# Patient Record
Sex: Male | Born: 1940
Health system: Southern US, Community
[De-identification: ages and names within clinical notes are randomized; demographics above are authoritative.]

## PROBLEM LIST (undated history)

## (undated) DIAGNOSIS — K219 Gastro-esophageal reflux disease without esophagitis: Secondary | ICD-10-CM

## (undated) DIAGNOSIS — M1990 Unspecified osteoarthritis, unspecified site: Secondary | ICD-10-CM

## (undated) DIAGNOSIS — J449 Chronic obstructive pulmonary disease, unspecified: Secondary | ICD-10-CM

## (undated) HISTORY — DX: Unspecified osteoarthritis, unspecified site: M19.90

## (undated) HISTORY — DX: Gastro-esophageal reflux disease without esophagitis: K21.9

## (undated) HISTORY — PX: CARPAL TUNNEL RELEASE: SHX101

## (undated) HISTORY — PX: ROTATOR CUFF REPAIR: SHX139

## (undated) HISTORY — PX: APPENDECTOMY: SHX54

## (undated) HISTORY — PX: OTHER SURGICAL HISTORY: SHX169

## (undated) HISTORY — DX: Chronic obstructive pulmonary disease, unspecified: J44.9

---

## 2011-07-11 HISTORY — PX: COLONOSCOPY: SHX174

## 2013-08-05 ENCOUNTER — Telehealth: Payer: Self-pay | Admitting: Family Medicine

## 2013-08-05 NOTE — Telephone Encounter (Signed)
Patient has a new patient appointment with you on 10/21/13.  Patient has [employer] and he had to switch from his old doctor.  Patient was at [facility] on 06/26/13 with a ruptured appendix.  Patient went back on new years eve because he had an abscess in area where appendix was and his pelvis.  He went back last Thursday to get his tube out and he was having diarrhea.  [facility] told them if the diarrhea continued, he should see his PCP because there could be an infection.  Can you see patient sooner?

## 2013-08-05 NOTE — Telephone Encounter (Signed)
Yes, 16min appointment as soon as possible, try to get records ahead of time.  If emergently sick, to ER.  Thanks.

## 2013-08-07 ENCOUNTER — Encounter: Payer: Self-pay | Admitting: Family Medicine

## 2013-08-07 ENCOUNTER — Ambulatory Visit (INDEPENDENT_AMBULATORY_CARE_PROVIDER_SITE_OTHER): Payer: Medicare HMO | Admitting: Family Medicine

## 2013-08-07 VITALS — BP 116/80 | HR 84 | Temp 97.4°F | Ht 67.0 in | Wt 177.5 lb

## 2013-08-07 DIAGNOSIS — R197 Diarrhea, unspecified: Secondary | ICD-10-CM

## 2013-08-07 DIAGNOSIS — J449 Chronic obstructive pulmonary disease, unspecified: Secondary | ICD-10-CM

## 2013-08-07 MED ORDER — METRONIDAZOLE 500 MG PO TABS: 500.0000 mg | ORAL_TABLET | Freq: Three times a day (TID) | ORAL | Status: DC

## 2013-08-07 NOTE — Patient Instructions (Signed)
Go to the lab on the way out.  We'll contact you with your lab report. Start flagyl 3 times a day.  Don't drink alcohol with the medicine.  Give me an update in a few days.  Take care.  Glad to see you.

## 2013-08-07 NOTE — Assessment & Plan Note (Signed)
Requesting old records.  Concern for C diff. D/w pt.  He'll collect sample and then start flagyl. etoh caution given.  He agrees. Nontoxic. >25 min spent with face to face with patient, >50% counseling and/or coordinating care.

## 2013-08-07 NOTE — Progress Notes (Signed)
Pre-visit discussion using our clinic review tool. No additional management support is needed unless otherwise documented below in the visit note.  Appy at [facility], then with return for drain placement x2 and abx.  Off abx now, drains out.  Now with diarrhea. Mucous but no blood in stool. Greasy stools. No fevers. Frequent BMs, not solid.   PMH and SH reviewed  ROS: See HPI, otherwise noncontributory.  Meds, vitals, and allergies reviewed.   GEN: nad, alert and oriented HEENT: mucous membranes moist NECK: supple w/o LA CV: rrr.  Sternotomy scar noted PULM: ctab, no inc wob ABD: soft, +bs, no rebound, not ttp, drain and appy sites healed. EXT: no edema SKIN: no acute rash

## 2013-08-08 ENCOUNTER — Telehealth: Payer: Self-pay | Admitting: Family Medicine

## 2013-08-08 NOTE — Telephone Encounter (Signed)
Relevant patient education mailed to patient.  

## 2013-08-08 NOTE — Telephone Encounter (Signed)
Changed in computer.

## 2013-08-08 NOTE — Telephone Encounter (Signed)
Pt made a mistake about the pharmacy he uses. The correct pharmacy is [place] called in yesterday was transferred  to [address] Please change in pt chart.

## 2013-08-11 ENCOUNTER — Telehealth: Payer: Self-pay | Admitting: *Deleted

## 2013-08-11 LAB — CLOSTRIDIUM DIFFICILE EIA

## 2013-08-11 NOTE — Telephone Encounter (Signed)
Already routed to staff.

## 2013-08-11 NOTE — Telephone Encounter (Signed)
CALL REPORT  Positive for C-diff

## 2013-08-26 ENCOUNTER — Encounter: Payer: Self-pay | Admitting: Family Medicine

## 2013-08-26 DIAGNOSIS — R911 Solitary pulmonary nodule: Secondary | ICD-10-CM | POA: Insufficient documentation

## 2013-09-03 ENCOUNTER — Encounter: Payer: Self-pay | Admitting: Family Medicine

## 2013-09-03 ENCOUNTER — Ambulatory Visit (INDEPENDENT_AMBULATORY_CARE_PROVIDER_SITE_OTHER): Payer: Commercial Managed Care - HMO | Admitting: Family Medicine

## 2013-09-03 VITALS — BP 138/78 | HR 96 | Temp 97.7°F | Wt 180.5 lb

## 2013-09-03 DIAGNOSIS — R197 Diarrhea, unspecified: Secondary | ICD-10-CM | POA: Diagnosis not present

## 2013-09-03 DIAGNOSIS — R059 Cough, unspecified: Secondary | ICD-10-CM | POA: Diagnosis not present

## 2013-09-03 DIAGNOSIS — R05 Cough: Secondary | ICD-10-CM | POA: Insufficient documentation

## 2013-09-03 NOTE — Assessment & Plan Note (Signed)
Resolved

## 2013-09-03 NOTE — Progress Notes (Signed)
Pre visit review using our clinic review tool, if applicable. No additional management support is needed unless otherwise documented below in the visit note.  H/o C diff. Prev diarrhea is resolved.  No mucous now.  No fevers, no abd pain.  Off flagyl now.    He just returned from a trip out [place] with family.  He had a good trip.    Cough started about 2-3 days ago.  "Raw under my throat."  Chest feels tight.  Using his spiriva daily, SABA about 2 times in the last week with some temporary relief.   No wheeze noted by patient.  No sputum usually.  Occ/rare sputum, generally clear.  He had one episode of discolored sputum a few days ago, but no more in the meantime.   Rhinorrhea.  No ear pain.  No facial pain.  He had a recent HA, that had resolved.  Can walk w/o getting SOB; he can get SOB going up a few flights of stairs but this is at baseline.   Meds, vitals, and allergies reviewed.   ROS: See HPI.  Otherwise, noncontributory.  nad ncat Tm wnl B Nasal and OP exam wnl except for mild OP cobblestoning.  Neck supple, no LA Ctab except for UAN.   No inc in wob. abd soft Ext well perfused.

## 2013-09-03 NOTE — Assessment & Plan Note (Signed)
Nontoxic.  Likely viral. Would use SABA more frequently in the meantime and this should resolve.  F/u prn.  Would like to avoid abx if at all possible given recent C diff.  D/w pt.  He agrees.

## 2013-09-03 NOTE — Patient Instructions (Signed)
Use the spiriva daily as you normally would.  Use the albuterol up to 3 times a day in the meantime for the cough.  That should help some.  If you get more short of breath or cough up a lot of discolored sputum, then notify us.  If you have more diarrhea with mucous, then call us.  Take care.

## 2013-09-08 ENCOUNTER — Telehealth: Payer: Self-pay | Admitting: Family Medicine

## 2013-09-08 MED ORDER — METRONIDAZOLE 500 MG PO TABS: 500.0000 mg | ORAL_TABLET | Freq: Three times a day (TID) | ORAL | Status: DC

## 2013-09-08 NOTE — Telephone Encounter (Signed)
Restart flagyl, update [place] in 1-2 days.  Strict hand washing.  I need an update to make some plans about his meds in the next few days.  Thanks. rx sent.

## 2013-09-08 NOTE — Telephone Encounter (Signed)
Pt is calling and wanted to let you know that he still has an infection in his bowel. Pt has been in and out of the bathroom with diarrhea. Please advise.

## 2013-09-08 NOTE — Telephone Encounter (Signed)
No vomiting or fever.  No bloody stools but there is "phlegm" with the stools just as it was before when he had the infection in his stomach.  Patient has had diarrhea 5 times since 5:30 this morning.  Please advise.

## 2013-09-08 NOTE — Telephone Encounter (Signed)
Patient advised.

## 2013-09-08 NOTE — Telephone Encounter (Signed)
Please call him back and see what details you can get- frequency, bloody, vomiting, fever, overall progression, etc.  Thanks.

## 2013-10-06 ENCOUNTER — Ambulatory Visit (INDEPENDENT_AMBULATORY_CARE_PROVIDER_SITE_OTHER): Payer: Medicare HMO | Admitting: Family Medicine

## 2013-10-06 ENCOUNTER — Encounter: Payer: Self-pay | Admitting: Family Medicine

## 2013-10-06 VITALS — BP 130/84 | HR 87 | Temp 97.7°F | Wt 183.8 lb

## 2013-10-06 DIAGNOSIS — R197 Diarrhea, unspecified: Secondary | ICD-10-CM

## 2013-10-06 MED ORDER — NONFORMULARY OR COMPOUNDED ITEM: Status: DC

## 2013-10-06 NOTE — Patient Instructions (Addendum)
I called in the vancomycin to [place].   [place], [place] Start it when they call you.   If you aren't improving, then let me know.  Take care.

## 2013-10-06 NOTE — Progress Notes (Signed)
Pre visit review using our clinic review tool, if applicable. No additional management support is needed unless otherwise documented below in the visit note.  H/o C diff late 07/2013.  Got better with flagyl, then sx returned early 09/2013.  Restarted flagyl and improved. Now with return of sx last week.  Mucous in stool.  No fevers.  Mild abd pain, not cramping but "churning", better with a BM.  Frequent stools recently.  No blood in stool.    Meds, vitals, and allergies reviewed.   ROS: See HPI.  Otherwise, noncontributory.  GEN: nad, alert and oriented HEENT: mucous membranes moist NECK: supple w/o LA CV: rrr.  PULM: ctab, no inc wob ABD: soft, +bs, not ttp EXT: no edema SKIN: no acute rash

## 2013-10-07 NOTE — Assessment & Plan Note (Signed)
Presumed return of c diff.  Would treat even if neg c diff given the recent event. D/w pt.  vanc taper, have him update [place] if not improved. D/w pt about precautions in meantime. Nontoxic.

## 2013-10-16 ENCOUNTER — Telehealth: Payer: Self-pay

## 2013-10-16 DIAGNOSIS — R197 Diarrhea, unspecified: Secondary | ICD-10-CM

## 2013-10-16 NOTE — Telephone Encounter (Signed)
Mrs Leavitt said pt was seen 10/06/13 and has been taking vancomycin 125 mg 4 x a day for 1 week; pt cannot see any improvement; pt still having diarrhea with trace of mucus in stool. Pt has had diarrhea x 4 today. No fever, N or V and no abd pain. Pt is also taking probiotic and eating yogurt. Pt wants to know if he should see some improvement by now. [place]. Mrs Macgowan request cb.

## 2013-10-16 NOTE — Telephone Encounter (Signed)
Patient advised.

## 2013-10-16 NOTE — Telephone Encounter (Signed)
I hoped that he would have already had some improvement. I would recheck his stool studies at this point and if still positive, we may have to get him to see the ID clinic.  I'll await the stool studies.  Thanks.  Orders are in. Continue the medicine for now.  Thanks.

## 2013-10-17 ENCOUNTER — Other Ambulatory Visit: Payer: Medicare HMO

## 2013-10-17 DIAGNOSIS — R197 Diarrhea, unspecified: Secondary | ICD-10-CM

## 2013-10-17 NOTE — Addendum Note (Signed)
Addended by: Ellamae Sia on: 10/17/2013 09:27 AM   Modules accepted: Orders

## 2013-10-18 LAB — CLOSTRIDIUM DIFFICILE EIA: CDIFTX: NEGATIVE

## 2013-10-21 ENCOUNTER — Encounter: Payer: Self-pay | Admitting: *Deleted

## 2013-10-21 ENCOUNTER — Ambulatory Visit: Payer: Self-pay | Admitting: Family Medicine

## 2013-10-21 LAB — STOOL CULTURE

## 2013-11-03 ENCOUNTER — Telehealth: Payer: Self-pay | Admitting: Family Medicine

## 2013-11-03 DIAGNOSIS — R197 Diarrhea, unspecified: Secondary | ICD-10-CM

## 2013-11-03 NOTE — Telephone Encounter (Signed)
Patient Information:  Caller Name: Ricardo Wilson  Phone: [phone]  Patient: Ricardo Wilson, Ricardo Wilson  Gender: Male  DOB: [birth year]  Age: 73 Years  PCP: Elsie Stain Brigitte Pulse) [facility])  Office Follow Up:  Does the office need to follow up with this patient?: Yes  Instructions For The Office: Wife/Kerry and patient asking if he needs to continue on the current Vancomycin for 49 days or if he needs to make appointment with gastroenterologist.  Wanting to know before refilling the expensive medication Please review and contact wife Ricardo Wilson at [phone] until 2 pm or patient's cell  [phone] after 2 pm.   Symptoms  Reason For Call & Symptoms: Ruptured appendix in 06/2013    bowel problem since 07/2013  had C-Diff infection.  Did not improve so started  Vancomycin for 49 days.  Pharmacy would not give all of medication  at one time.  First round of Rx cost $200.  Now considering getting second round of Rx to continue toward the total 49 days.  Feels he has not improved, still having 5-6 diarrthea stools per day.  Repeated C-Diff and told it was negative.  Reviewed Health History In EMR: Yes  Reviewed Medications In EMR: Yes  Reviewed Allergies In EMR: Yes  Reviewed Surgeries / Procedures: Yes  Date of Onset of Symptoms: Unknown  Treatments Tried: Vancomycin  Treatments Tried Worked: No  Guideline(s) Used:  Diarrhea  Disposition Per Guideline:   Callback by PCP Today  Reason For Disposition Reached:   Age > 50 years  Advice Given:  N/A  Patient Will Follow Care Advice:  YES

## 2013-11-03 NOTE — Telephone Encounter (Signed)
Patient advised.   Patient says he is not having watery diarrhea now but is having relatively small amounts of formed stool 6 or 8 times a day.

## 2013-11-03 NOTE — Telephone Encounter (Signed)
I would fill the next round now and go ahead with he GI eval.  I put in the referral.  Thanks.

## 2013-11-03 NOTE — Telephone Encounter (Signed)
Then I would continue as planned. Thanks.

## 2013-11-03 NOTE — Telephone Encounter (Signed)
Patient advised.

## 2014-06-02 ENCOUNTER — Encounter: Payer: Self-pay | Admitting: Family Medicine

## 2014-06-02 ENCOUNTER — Ambulatory Visit (INDEPENDENT_AMBULATORY_CARE_PROVIDER_SITE_OTHER): Payer: Commercial Managed Care - HMO | Admitting: Family Medicine

## 2014-06-02 VITALS — BP 126/72 | HR 64 | Temp 97.8°F | Wt 185.5 lb

## 2014-06-02 DIAGNOSIS — IMO0001 Reserved for inherently not codable concepts without codable children: Secondary | ICD-10-CM | POA: Insufficient documentation

## 2014-06-02 DIAGNOSIS — S61210D Laceration without foreign body of right index finger without damage to nail, subsequent encounter: Secondary | ICD-10-CM

## 2014-06-02 NOTE — Assessment & Plan Note (Signed)
Will need to heal by second intention - discussed this. Tendon does not seem to be involved. Does not appear infected. Pt having trouble with augmentin but desires to continue with this treatment. rec take with food, start eating yogurt while on med Red flags to seek urgent care discussed, otherwise return in 3d with PCP or myself for recheck.

## 2014-06-02 NOTE — Patient Instructions (Addendum)
Wound cleaned and dressed today with antibiotic ointment. Watch for spreading redness past dressing or return sooner if worsening pain at finger. Take augmentin (amoxicillin/clavulanate) with a meal and start eating yogurt once daily. Return in 3 days for recheck.

## 2014-06-02 NOTE — Progress Notes (Signed)
   BP 126/72 mmHg  Pulse 64  Temp(Src) 97.8 F (36.6 C) (Oral)  Wt 185 lb 8 oz (84.142 kg)   CC: ER follow up  Subjective:    Patient ID: Ricardo Wilson., male    DOB: 1941-05-16, 73 y.o.   MRN: [id number]  HPI: Adley Mazurowski. is a 73 y.o. male presenting on 06/02/2014 for Follow-up   Suffered dog bite 6 days ago (05/28/2014), it was his own dog (small poodle) while he was putting it in his crate, wrapped up his hand due to bleeding but did not seek care.   Checked his hand the next morning and decided to seek care at ER in [facility] (just outside of [place]) and treated with augmentin 875/125mg  bid which has caused diarrhea but otherwise tolerating well. No records available, pt does not remember hospital name. Maudie Mercury will try and obtain records.  No fevers/chills  Relevant past medical, surgical, family and social history reviewed and updated as indicated.  Allergies and medications reviewed and updated. Current Outpatient Prescriptions on File Prior to Visit  Medication Sig  . albuterol (PROVENTIL HFA;VENTOLIN HFA) 108 (90 BASE) MCG/ACT inhaler Inhale into the lungs every 6 (six) hours as needed for wheezing or shortness of breath.  . celecoxib (CELEBREX) 200 MG capsule Take 200 mg by mouth daily.  . ranitidine (ZANTAC) 300 MG tablet Take 300 mg by mouth 2 (two) times daily.  Marland Kitchen tiotropium (SPIRIVA) 18 MCG inhalation capsule Place 18 mcg into inhaler and inhale daily.   No current facility-administered medications on file prior to visit.    Review of Systems Per HPI unless specifically indicated above    Objective:    BP 126/72 mmHg  Pulse 64  Temp(Src) 97.8 F (36.6 C) (Oral)  Wt 185 lb 8 oz (84.142 kg)  Physical Exam  Constitutional: He appears well-developed and well-nourished. No distress.  Musculoskeletal: He exhibits no edema.  R hand 2nd digit with transverse laceration approx 1.5cm length across palmar surface of PIP without surrounding erythema or  induration. Laceration edges not approximated. Smaller laceration distal to initial one Able to flex/extend at 2nd digit IP joints  Nursing note and vitals reviewed.  Wound cleaned with betadine then soaked in NS, then dressing reapplied.    Assessment & Plan:   Problem List Items Addressed This Visit    Laceration of second finger, right - Primary    Will need to heal by second intention - discussed this. Tendon does not seem to be involved. Does not appear infected. Pt having trouble with augmentin but desires to continue with this treatment. rec take with food, start eating yogurt while on med Red flags to seek urgent care discussed, otherwise return in 3d with PCP or myself for recheck.        Follow up plan: Return in about 3 days (around 06/05/2014), or if symptoms worsen or fail to improve, for follow up visit.

## 2014-06-02 NOTE — Progress Notes (Signed)
Pre visit review using our clinic review tool, if applicable. No additional management support is needed unless otherwise documented below in the visit note. 

## 2014-06-05 ENCOUNTER — Ambulatory Visit (INDEPENDENT_AMBULATORY_CARE_PROVIDER_SITE_OTHER): Payer: Commercial Managed Care - HMO | Admitting: Family Medicine

## 2014-06-05 ENCOUNTER — Encounter: Payer: Self-pay | Admitting: Family Medicine

## 2014-06-05 VITALS — BP 120/80 | HR 64 | Temp 97.7°F | Wt 183.0 lb

## 2014-06-05 DIAGNOSIS — S61210D Laceration without foreign body of right index finger without damage to nail, subsequent encounter: Secondary | ICD-10-CM

## 2014-06-05 DIAGNOSIS — Z23 Encounter for immunization: Secondary | ICD-10-CM

## 2014-06-05 MED ORDER — RANITIDINE HCL 300 MG PO TABS: 300.0000 mg | ORAL_TABLET | Freq: Two times a day (BID) | ORAL | Status: DC

## 2014-06-05 MED ORDER — CELECOXIB 200 MG PO CAPS: 200.0000 mg | ORAL_CAPSULE | Freq: Every day | ORAL | Status: DC

## 2014-06-05 MED ORDER — ALBUTEROL SULFATE HFA 108 (90 BASE) MCG/ACT IN AERS: 1.0000 | INHALATION_SPRAY | Freq: Four times a day (QID) | RESPIRATORY_TRACT | Status: DC | PRN

## 2014-06-05 MED ORDER — TIOTROPIUM BROMIDE MONOHYDRATE 18 MCG IN CAPS: 18.0000 ug | ORAL_CAPSULE | Freq: Every day | RESPIRATORY_TRACT | Status: DC

## 2014-06-05 NOTE — Progress Notes (Signed)
Pre visit review using our clinic review tool, if applicable. No additional management support is needed unless otherwise documented below in the visit note.  Injury date was 05/28/14.  Treated out of town at [place], had f/u with Dr. Darnell Level.  Splinted in meantime, prev notes reviewed. Splint is annoying, but protecting his finger well.  Less redness and pain in the meantime.  Here for f/u and recheck. Due for tetanus, on abx.  No ADE on med.   Meds, vitals, and allergies reviewed.   ROS: See HPI.  Otherwise, noncontributory.  nad ncat R hand 2nd digit with transverse laceration approx 1.5cm length across palmar surface of PIP without surrounding erythema or induration.  Laceration edges not approximated but appears to be healing well by secondary intention.  Able to flex/extend at 2nd digit IP joints  His hand is NV intact.

## 2014-06-05 NOTE — Patient Instructions (Addendum)
Recheck on Tuesday or Wednesday of next week.   Keep the splint on for now.  Take care.  Glad to see you.  Schedule a physical for spring of 2016.

## 2014-06-08 NOTE — Assessment & Plan Note (Signed)
Tetanus shot today, I shortened his splint by about 1cm to make it more comfortable.  He liked the change.  The finger is still well protected.  Continue abx in the meantime.  I'll recheck him next week.  Call back sooner prn.  He agrees.

## 2014-06-09 ENCOUNTER — Encounter: Payer: Self-pay | Admitting: Family Medicine

## 2014-06-09 ENCOUNTER — Ambulatory Visit: Payer: Commercial Managed Care - HMO | Admitting: Family Medicine

## 2014-06-09 VITALS — BP 124/82 | HR 75 | Temp 97.6°F | Wt 183.8 lb

## 2014-06-09 DIAGNOSIS — S61210D Laceration without foreign body of right index finger without damage to nail, subsequent encounter: Secondary | ICD-10-CM

## 2014-06-09 MED ORDER — ALBUTEROL SULFATE HFA 108 (90 BASE) MCG/ACT IN AERS
1.0000 | INHALATION_SPRAY | Freq: Four times a day (QID) | RESPIRATORY_TRACT | Status: DC | PRN
Start: 2014-06-09 — End: 2016-03-24

## 2014-06-09 NOTE — Patient Instructions (Signed)
No charge for today's visit.  Keep using the splint for another few days, then gradually wean out of the splint.   If you have any more redness or pain, then let me know.

## 2014-06-09 NOTE — Progress Notes (Signed)
Pre visit review using our clinic review tool, if applicable. No additional management support is needed unless otherwise documented below in the visit note.  Here for f/u finger lac.  In splint.  Done with abx.  No fevers or drainage.  Doing well overall.   Meds, vitals, and allergies reviewed.   ROS: See HPI.  Otherwise, noncontributory.  nad R 2nd finger with good granulation tissue on the lac w/o spreading erythema.  NV intact.  Area redressed and resplinted.

## 2014-06-10 NOTE — Assessment & Plan Note (Signed)
Improving, wouldn't continue abx at this point.  Use splint for a few more days, then wean out (but would still use when working with his hands).  He agrees.  F/u prn.  Should do well, though with a scar likely to form. No charge for visit.

## 2014-07-13 ENCOUNTER — Encounter: Payer: Self-pay | Admitting: Family Medicine

## 2014-07-24 ENCOUNTER — Telehealth: Payer: Self-pay | Admitting: *Deleted

## 2014-07-24 NOTE — Telephone Encounter (Signed)
Lm on pts vm requesting a call back if wanting to schedule a flu shot 

## 2014-12-28 ENCOUNTER — Other Ambulatory Visit: Payer: Self-pay | Admitting: Family Medicine

## 2014-12-28 NOTE — Telephone Encounter (Signed)
Electronic refill request.  Patient has only had acute visits for the entire year of 2015.  Please advise.  Celebrex Last Filled:    30 capsule 5 Rf on 06/05/2014  Ranitidine Last Filled:    60 tablet 5 RF on 06/05/2014

## 2014-12-29 NOTE — Telephone Encounter (Signed)
Sent.  Needs CPE scheduled.  Thanks.  

## 2014-12-29 NOTE — Telephone Encounter (Signed)
Left detailed message on voicemail.  

## 2015-07-11 ENCOUNTER — Telehealth: Payer: Self-pay | Admitting: Family Medicine

## 2015-07-13 NOTE — Telephone Encounter (Signed)
Electronic refill request. Last Filled:    30 capsule 5 12/29/2014  Please advise.

## 2015-07-14 NOTE — Telephone Encounter (Signed)
Please call patient and  CPE as instructed.

## 2015-07-14 NOTE — Telephone Encounter (Signed)
Sent.  Due for CPE.  Thanks.  

## 2015-07-15 NOTE — Telephone Encounter (Signed)
Spoke with pt  He didn't want to schedule appointment at this time Please close

## 2015-09-13 ENCOUNTER — Other Ambulatory Visit: Payer: Self-pay | Admitting: *Deleted

## 2015-09-13 NOTE — Telephone Encounter (Signed)
If he'll schedule a physical, then send #30 with 2 rf.  If not, then decline it.   He should have routine labs if still going to take this medicine.

## 2015-09-13 NOTE — Telephone Encounter (Signed)
Received faxed refill Last refill 07/14/15 #30/1 Last office visit 06/09/14 Patient advised on 07/15/15 that he needed to schedule a CPE which he declined to schedule an appointment.

## 2015-09-14 NOTE — Telephone Encounter (Signed)
Spoke with patient who said if he has to come in for an OV, [place], he will just stop taking it.  I thanked the patient for his time and declined the refill as instructed.

## 2015-09-14 NOTE — Telephone Encounter (Signed)
See below.  Discharge patient.  Thanks.

## 2015-12-16 ENCOUNTER — Telehealth: Payer: Self-pay | Admitting: Family Medicine

## 2015-12-16 NOTE — Telephone Encounter (Signed)
Spoke to pt wife. She has been trying to get pt to make appt. Per [facility] has not been seen since 2015. Pt wife will tell pt to call back and schedule.

## 2016-01-18 ENCOUNTER — Other Ambulatory Visit: Payer: Self-pay | Admitting: *Deleted

## 2016-01-18 MED ORDER — RANITIDINE HCL 300 MG PO TABS: 300.0000 mg | ORAL_TABLET | Freq: Two times a day (BID) | ORAL | Status: DC

## 2016-01-22 ENCOUNTER — Observation Stay (HOSPITAL_COMMUNITY)
Admission: EM | Admit: 2016-01-22 | Discharge: 2016-01-24 | Disposition: A | Discharge status: Home / Self Care | Payer: Commercial Managed Care - HMO | Attending: Surgery | Admitting: Surgery

## 2016-01-22 ENCOUNTER — Encounter (HOSPITAL_COMMUNITY): Payer: Self-pay

## 2016-01-22 DIAGNOSIS — K219 Gastro-esophageal reflux disease without esophagitis: Secondary | ICD-10-CM | POA: Insufficient documentation

## 2016-01-22 DIAGNOSIS — J449 Chronic obstructive pulmonary disease, unspecified: Secondary | ICD-10-CM | POA: Insufficient documentation

## 2016-01-22 DIAGNOSIS — K4030 Unilateral inguinal hernia, with obstruction, without gangrene, not specified as recurrent: Secondary | ICD-10-CM | POA: Diagnosis not present

## 2016-01-22 DIAGNOSIS — Z87891 Personal history of nicotine dependence: Secondary | ICD-10-CM | POA: Insufficient documentation

## 2016-01-22 DIAGNOSIS — Z79899 Other long term (current) drug therapy: Secondary | ICD-10-CM | POA: Insufficient documentation

## 2016-01-22 DIAGNOSIS — K4090 Unilateral inguinal hernia, without obstruction or gangrene, not specified as recurrent: Secondary | ICD-10-CM

## 2016-01-22 LAB — I-STAT CHEM 8, ED
BUN: 24 mg/dL — ABNORMAL HIGH (ref 6–20)
Calcium, Ion: 1.17 mmol/L (ref 1.12–1.23)
Chloride: 104 mmol/L (ref 101–111)
Creatinine, Ser: 1.1 mg/dL (ref 0.61–1.24)
Glucose, Bld: 112 mg/dL — ABNORMAL HIGH (ref 65–99)
HCT: 41 % (ref 39.0–52.0)
Hemoglobin: 13.9 g/dL (ref 13.0–17.0)
Potassium: 4.2 mmol/L (ref 3.5–5.1)
Sodium: 141 mmol/L (ref 135–145)
TCO2: 25 mmol/L (ref 0–100)

## 2016-01-22 LAB — CBC WITH DIFFERENTIAL/PLATELET
Basophils Absolute: 0 10*3/uL (ref 0.0–0.1)
Basophils Relative: 0 %
Eosinophils Absolute: 0.2 10*3/uL (ref 0.0–0.7)
Eosinophils Relative: 2 %
HCT: 42.8 % (ref 39.0–52.0)
Hemoglobin: 14.5 g/dL (ref 13.0–17.0)
Lymphocytes Relative: 28 %
Lymphs Abs: 2.6 10*3/uL (ref 0.7–4.0)
MCH: 29.9 pg (ref 26.0–34.0)
MCHC: 33.9 g/dL (ref 30.0–36.0)
MCV: 88.2 fL (ref 78.0–100.0)
Monocytes Absolute: 0.6 10*3/uL (ref 0.1–1.0)
Monocytes Relative: 6 %
Neutro Abs: 5.7 10*3/uL (ref 1.7–7.7)
Neutrophils Relative %: 64 %
Platelets: 201 10*3/uL (ref 150–400)
RBC: 4.85 MIL/uL (ref 4.22–5.81)
RDW: 14.1 % (ref 11.5–15.5)
WBC: 9.1 10*3/uL (ref 4.0–10.5)

## 2016-01-22 MED ORDER — IOPAMIDOL (ISOVUE-300) INJECTION 61%
INTRAVENOUS | Status: AC
  Administered 2016-01-23: 100 mL
  Filled 2016-01-22: qty 100

## 2016-01-22 MED ORDER — IOPAMIDOL (ISOVUE-300) INJECTION 61%
INTRAVENOUS | Status: AC
  Filled 2016-01-22: qty 100

## 2016-01-22 NOTE — ED Notes (Signed)
Onset 5:30-6p pt noticed lump left inguinal area.  Intermittant sharp pains, lasts 1-2 minutes, several times a hour.  No known injury.

## 2016-01-22 NOTE — ED Provider Notes (Signed)
CSN: [id number]     Arrival date & time 01/22/16  1955 History   First MD Initiated Contact with Patient 01/22/16 2258     Chief Complaint  Patient presents with  . Hernia     (Consider location/radiation/quality/duration/timing/severity/associated sxs/prior Treatment) HPI Comments: Patient presents with pain in his left inguinal area with associated swelling. He states that earlier this evening he noticed a sudden pain in his lower abdomen and swelling in his left inguinal area. He states he was having intermittent crampy pain across his lower abdomen. There is no nausea or vomiting. He has never noticed a lump in his inguinal area in the past. He does say he's been doing some mildly heavily lifting with some boxes. No history of known hernias in the past. No fevers. No urinary problems. He states the pain has improved since she's been in the emergency department and currently denies any pain. He states the swelling has improved as well.   Past Medical History  Diagnosis Date  . COPD (chronic obstructive pulmonary disease) ([place])   . Arthritis   . GERD (gastroesophageal reflux disease)    Past Surgical History  Procedure Laterality Date  . Appendectomy    . Lung resection    . Rotator cuff repair Bilateral   . Colonoscopy  2013    tubular adenoma x2   Family History  Problem Relation Age of Onset  . Uterine cancer Mother   . Alcohol abuse Father   . Diabetes Father    Social History  Substance Use Topics  . Smoking status: Former Research scientist (life sciences)  . Smokeless tobacco: Never Used     Comment: quit 1986  . Alcohol Use: Yes    Review of Systems  Constitutional: Negative for fever, chills, diaphoresis and fatigue.  HENT: Negative for congestion, rhinorrhea and sneezing.   Eyes: Negative.   Respiratory: Negative for cough, chest tightness and shortness of breath.   Cardiovascular: Negative for chest pain and leg swelling.  Gastrointestinal: Positive for abdominal pain. Negative for  nausea, vomiting, diarrhea and blood in stool.  Genitourinary: Positive for scrotal swelling. Negative for frequency, hematuria, flank pain and difficulty urinating.  Musculoskeletal: Negative for back pain and arthralgias.  Skin: Negative for rash.  Neurological: Negative for dizziness, speech difficulty, weakness, numbness and headaches.      Allergies  Aspirin  Home Medications   Prior to Admission medications   Medication Sig Start Date End Date Taking? Authorizing Provider  albuterol (PROVENTIL HFA;VENTOLIN HFA) 108 (90 BASE) MCG/ACT inhaler Inhale 1-2 puffs into the lungs every 6 (six) hours as needed for wheezing or shortness of breath. Please fill either proair HFA or ventolin, whichever is cheaper 06/09/14  Yes Tonia Ghent, MD  celecoxib (CELEBREX) 200 MG capsule take 1 capsule by mouth once daily 07/14/15  Yes Tonia Ghent, MD  ranitidine (ZANTAC) 300 MG tablet Take 1 tablet (300 mg total) by mouth 2 (two) times daily. 01/18/16  Yes Tonia Ghent, MD  tiotropium (SPIRIVA) 18 MCG inhalation capsule Place 1 capsule (18 mcg total) into inhaler and inhale daily. 06/05/14  Yes Tonia Ghent, MD   BP 131/66 mmHg  Pulse 84  Temp(Src) 97.9 F (36.6 C) (Oral)  Resp 16  SpO2 97% Physical Exam  Constitutional: He is oriented to person, place, and time. He appears well-developed and well-nourished.  HENT:  Head: Normocephalic and atraumatic.  Eyes: Pupils are equal, round, and reactive to light.  Neck: Normal range of motion. Neck supple.  Cardiovascular: Normal rate, regular rhythm and normal heart sounds.   Pulmonary/Chest: Effort normal and breath sounds normal. No respiratory distress. He has no wheezes. He has no rales. He exhibits no tenderness.  Abdominal: Soft. Bowel sounds are normal. There is no tenderness. There is no rebound and no guarding.  Genitourinary:  Patient has an area of swelling to his left inguinal canal.  It doesn't feel like a typical herina.  It is  slightly tender to palpation.  It is not reducible.  No pain to testicles  Musculoskeletal: Normal range of motion. He exhibits no edema.  Lymphadenopathy:    He has no cervical adenopathy.  Neurological: He is alert and oriented to person, place, and time.  Skin: Skin is warm and dry. No rash noted.  Psychiatric: He has a normal mood and affect.    ED Course  Procedures (including critical care time) Labs Review Labs Reviewed  BASIC METABOLIC PANEL - Abnormal; Notable for the following:    Glucose, Bld 114 (*)    BUN 22 (*)    All other components within normal limits  I-STAT CHEM 8, ED - Abnormal; Notable for the following:    BUN 24 (*)    Glucose, Bld 112 (*)    All other components within normal limits  CBC WITH DIFFERENTIAL/PLATELET    Imaging Review Ct Pelvis W Contrast  01/23/2016  CLINICAL DATA:  Left lower quadrant hernia, onset today.  Pain. EXAM: CT PELVIS WITH CONTRAST TECHNIQUE: Multidetector CT imaging of the pelvis was performed using the standard protocol following the bolus administration of intravenous contrast. CONTRAST:  131mL ISOVUE-300 IOPAMIDOL (ISOVUE-300) INJECTION 61% COMPARISON:  None. FINDINGS: There is a moderate size left inguinal hernia containing sigmoid colon. No proximal colonic obstruction. Diverticulosis of the visualized colon. Appendix is surgically absent. No evidence of diverticulitis. Prostate gland is not enlarged. Bladder wall is not thickened. No pelvic mass or lymphadenopathy. No destructive bone lesions. IMPRESSION: Moderate size left inguinal hernia containing sigmoid colon. No proximal colonic obstruction. Electronically Signed   By: Lucienne Capers M.D.   On: 01/23/2016 00:29   I have personally reviewed and evaluated these images and lab results as part of my medical decision-making.   EKG Interpretation None      MDM   Final diagnoses:  Left inguinal hernia    Patient presents with noted swelling to his left inguinal area.  His history sounds consistent with an inguinal hernia. I did try to reduce it which was unsuccessful. It felt a little atypical for a hernia in that there wasn't any soft or gaseous feeling to the knot. I did obtain a CT of the pelvis to verify that there was a hernia present versus residual swelling.  CT scan does show evidence of a moderate size left inguinal hernia with bowel. I will consult general surgery.  Dr. Hulen Skains has reduced the hernia, will admit for surgical fixation.  Malvin Johns, MD 01/23/16 [phone]

## 2016-01-23 ENCOUNTER — Observation Stay (HOSPITAL_COMMUNITY): Payer: Commercial Managed Care - HMO | Admitting: Anesthesiology

## 2016-01-23 ENCOUNTER — Encounter (HOSPITAL_COMMUNITY)
Admission: EM | Disposition: A | Discharge status: Home / Self Care | Payer: Self-pay | Source: Home / Self Care | Attending: Emergency Medicine

## 2016-01-23 ENCOUNTER — Encounter (HOSPITAL_COMMUNITY): Payer: Self-pay | Admitting: Radiology

## 2016-01-23 ENCOUNTER — Emergency Department (HOSPITAL_COMMUNITY): Payer: Commercial Managed Care - HMO

## 2016-01-23 DIAGNOSIS — Z79899 Other long term (current) drug therapy: Secondary | ICD-10-CM | POA: Diagnosis not present

## 2016-01-23 DIAGNOSIS — J449 Chronic obstructive pulmonary disease, unspecified: Secondary | ICD-10-CM | POA: Diagnosis not present

## 2016-01-23 DIAGNOSIS — K4030 Unilateral inguinal hernia, with obstruction, without gangrene, not specified as recurrent: Secondary | ICD-10-CM | POA: Diagnosis not present

## 2016-01-23 DIAGNOSIS — G8918 Other acute postprocedural pain: Secondary | ICD-10-CM | POA: Diagnosis not present

## 2016-01-23 DIAGNOSIS — Z87891 Personal history of nicotine dependence: Secondary | ICD-10-CM | POA: Diagnosis not present

## 2016-01-23 DIAGNOSIS — K4090 Unilateral inguinal hernia, without obstruction or gangrene, not specified as recurrent: Secondary | ICD-10-CM | POA: Diagnosis not present

## 2016-01-23 DIAGNOSIS — K219 Gastro-esophageal reflux disease without esophagitis: Secondary | ICD-10-CM | POA: Diagnosis not present

## 2016-01-23 HISTORY — PX: INGUINAL HERNIA REPAIR: SHX194

## 2016-01-23 LAB — BASIC METABOLIC PANEL
Anion gap: 5 (ref 5–15)
BUN: 22 mg/dL — ABNORMAL HIGH (ref 6–20)
CO2: 25 mmol/L (ref 22–32)
Calcium: 9.1 mg/dL (ref 8.9–10.3)
Chloride: 109 mmol/L (ref 101–111)
Creatinine, Ser: 1.01 mg/dL (ref 0.61–1.24)
GFR calc Af Amer: >60 mL/min (ref >60)
GFR calc non Af Amer: >60 mL/min (ref >60)
Glucose, Bld: 114 mg/dL — ABNORMAL HIGH (ref 65–99)
Potassium: 4.1 mmol/L (ref 3.5–5.1)
Sodium: 139 mmol/L (ref 135–145)

## 2016-01-23 LAB — SURGICAL PCR SCREEN
MRSA, PCR: NEGATIVE
Staphylococcus aureus: NEGATIVE

## 2016-01-23 SURGERY — REPAIR, HERNIA, INGUINAL, INCARCERATED
Anesthesia: Regional | Site: Inguinal | Laterality: Left

## 2016-01-23 MED ORDER — ARTIFICIAL TEARS OP OINT
TOPICAL_OINTMENT | OPHTHALMIC | Status: AC
  Filled 2016-01-23: qty 3.5

## 2016-01-23 MED ORDER — BUPIVACAINE-EPINEPHRINE 0.25% -1:200000 IJ SOLN
INTRAMUSCULAR | Status: DC | PRN
  Administered 2016-01-23: 10 mL

## 2016-01-23 MED ORDER — PROPOFOL 10 MG/ML IV BOLUS
INTRAVENOUS | Status: DC | PRN
  Administered 2016-01-23: 20 mg via INTRAVENOUS
  Administered 2016-01-23: 150 mg via INTRAVENOUS

## 2016-01-23 MED ORDER — DIPHENHYDRAMINE HCL 12.5 MG/5ML PO ELIX: 12.5000 mg | ORAL_SOLUTION | Freq: Four times a day (QID) | ORAL | Status: DC | PRN

## 2016-01-23 MED ORDER — DIPHENHYDRAMINE HCL 50 MG/ML IJ SOLN: 12.5000 mg | Freq: Four times a day (QID) | INTRAMUSCULAR | Status: DC | PRN

## 2016-01-23 MED ORDER — CEFAZOLIN SODIUM-DEXTROSE 2-4 GM/100ML-% IV SOLN
2.0000 g | INTRAVENOUS | Status: AC
  Administered 2016-01-23: 2 g via INTRAVENOUS
  Filled 2016-01-23 (×2): qty 100

## 2016-01-23 MED ORDER — DOCUSATE SODIUM 100 MG PO CAPS
100.0000 mg | ORAL_CAPSULE | Freq: Two times a day (BID) | ORAL | Status: DC
  Administered 2016-01-23 – 2016-01-24 (×3): 100 mg via ORAL
  Filled 2016-01-23 (×2): qty 1

## 2016-01-23 MED ORDER — ACETAMINOPHEN 325 MG PO TABS
650.0000 mg | ORAL_TABLET | Freq: Four times a day (QID) | ORAL | Status: DC | PRN
  Administered 2016-01-23 (×2): 650 mg via ORAL
  Filled 2016-01-23 (×2): qty 2

## 2016-01-23 MED ORDER — MIDAZOLAM HCL 2 MG/2ML IJ SOLN
2.0000 mg | Freq: Once | INTRAMUSCULAR | Status: DC
  Filled 2016-01-23: qty 2

## 2016-01-23 MED ORDER — SUGAMMADEX SODIUM 200 MG/2ML IV SOLN
INTRAVENOUS | Status: AC
  Filled 2016-01-23: qty 2

## 2016-01-23 MED ORDER — KCL IN DEXTROSE-NACL 10-5-0.45 MEQ/L-%-% IV SOLN
INTRAVENOUS | Status: DC
  Administered 2016-01-23 (×2): via INTRAVENOUS
  Filled 2016-01-23 (×4): qty 1000

## 2016-01-23 MED ORDER — LIDOCAINE HCL (CARDIAC) 20 MG/ML IV SOLN
INTRAVENOUS | Status: DC | PRN
  Administered 2016-01-23: 40 mg via INTRAVENOUS

## 2016-01-23 MED ORDER — ALBUTEROL SULFATE HFA 108 (90 BASE) MCG/ACT IN AERS
INHALATION_SPRAY | RESPIRATORY_TRACT | Status: DC | PRN
  Administered 2016-01-23: 4 via RESPIRATORY_TRACT

## 2016-01-23 MED ORDER — ENOXAPARIN SODIUM 40 MG/0.4ML ~~LOC~~ SOLN
40.0000 mg | SUBCUTANEOUS | Status: DC
  Administered 2016-01-24: 40 mg via SUBCUTANEOUS
  Filled 2016-01-23: qty 0.4

## 2016-01-23 MED ORDER — MIDAZOLAM HCL 2 MG/2ML IJ SOLN
INTRAMUSCULAR | Status: AC
  Filled 2016-01-23: qty 2

## 2016-01-23 MED ORDER — EPHEDRINE 5 MG/ML INJ
INTRAVENOUS | Status: AC
  Filled 2016-01-23: qty 10

## 2016-01-23 MED ORDER — FENTANYL CITRATE (PF) 100 MCG/2ML IJ SOLN
25.0000 ug | INTRAMUSCULAR | Status: DC | PRN
  Administered 2016-01-23 (×2): 50 ug via INTRAVENOUS

## 2016-01-23 MED ORDER — 0.9 % SODIUM CHLORIDE (POUR BTL) OPTIME
TOPICAL | Status: DC | PRN
  Administered 2016-01-23: 1000 mL

## 2016-01-23 MED ORDER — ONDANSETRON HCL 4 MG/2ML IJ SOLN
INTRAMUSCULAR | Status: AC
  Filled 2016-01-23: qty 2

## 2016-01-23 MED ORDER — LACTATED RINGERS IV SOLN
INTRAVENOUS | Status: DC | PRN
  Administered 2016-01-23: 08:00:00 via INTRAVENOUS

## 2016-01-23 MED ORDER — BUPIVACAINE-EPINEPHRINE (PF) 0.25% -1:200000 IJ SOLN
INTRAMUSCULAR | Status: AC
  Filled 2016-01-23: qty 30

## 2016-01-23 MED ORDER — ONDANSETRON 4 MG PO TBDP: 4.0000 mg | ORAL_TABLET | Freq: Four times a day (QID) | ORAL | Status: DC | PRN

## 2016-01-23 MED ORDER — SUCCINYLCHOLINE CHLORIDE 200 MG/10ML IV SOSY
PREFILLED_SYRINGE | INTRAVENOUS | Status: AC
  Filled 2016-01-23: qty 10

## 2016-01-23 MED ORDER — LIDOCAINE 2% (20 MG/ML) 5 ML SYRINGE
INTRAMUSCULAR | Status: AC
  Filled 2016-01-23: qty 10

## 2016-01-23 MED ORDER — OXYCODONE-ACETAMINOPHEN 5-325 MG PO TABS: 1.0000 | ORAL_TABLET | ORAL | Status: DC | PRN

## 2016-01-23 MED ORDER — LIDOCAINE HCL 4 % EX SOLN
CUTANEOUS | Status: DC | PRN
  Administered 2016-01-23: 3 mL via TOPICAL

## 2016-01-23 MED ORDER — FENTANYL CITRATE (PF) 100 MCG/2ML IJ SOLN
INTRAMUSCULAR | Status: DC | PRN
  Administered 2016-01-23 (×2): 50 ug via INTRAVENOUS

## 2016-01-23 MED ORDER — TIOTROPIUM BROMIDE MONOHYDRATE 18 MCG IN CAPS
18.0000 ug | ORAL_CAPSULE | Freq: Every day | RESPIRATORY_TRACT | Status: DC
  Filled 2016-01-23: qty 5

## 2016-01-23 MED ORDER — ONDANSETRON HCL 4 MG/2ML IJ SOLN: 4.0000 mg | Freq: Four times a day (QID) | INTRAMUSCULAR | Status: DC | PRN

## 2016-01-23 MED ORDER — FENTANYL CITRATE (PF) 250 MCG/5ML IJ SOLN
INTRAMUSCULAR | Status: AC
  Filled 2016-01-23: qty 5

## 2016-01-23 MED ORDER — DEXTROSE 5 % IV SOLN
10.0000 mg | INTRAVENOUS | Status: DC | PRN
  Administered 2016-01-23: 50 ug/min via INTRAVENOUS

## 2016-01-23 MED ORDER — MIDAZOLAM HCL 5 MG/5ML IJ SOLN
INTRAMUSCULAR | Status: DC | PRN
  Administered 2016-01-23: .25 mg via INTRAVENOUS
  Administered 2016-01-23: 1 mg via INTRAVENOUS

## 2016-01-23 MED ORDER — POLYETHYLENE GLYCOL 3350 17 G PO PACK: 17.0000 g | PACK | Freq: Every day | ORAL | Status: DC | PRN

## 2016-01-23 MED ORDER — FENTANYL CITRATE (PF) 100 MCG/2ML IJ SOLN
50.0000 ug | Freq: Once | INTRAMUSCULAR | Status: DC
  Filled 2016-01-23: qty 2

## 2016-01-23 MED ORDER — ALBUTEROL SULFATE HFA 108 (90 BASE) MCG/ACT IN AERS
INHALATION_SPRAY | RESPIRATORY_TRACT | Status: AC
  Filled 2016-01-23: qty 6.7

## 2016-01-23 MED ORDER — ROCURONIUM BROMIDE 100 MG/10ML IV SOLN
INTRAVENOUS | Status: DC | PRN
  Administered 2016-01-23: 50 mg via INTRAVENOUS

## 2016-01-23 MED ORDER — ROCURONIUM BROMIDE 50 MG/5ML IV SOLN
INTRAVENOUS | Status: AC
  Filled 2016-01-23: qty 1

## 2016-01-23 MED ORDER — BUPIVACAINE-EPINEPHRINE (PF) 0.5% -1:200000 IJ SOLN
INTRAMUSCULAR | Status: DC | PRN
  Administered 2016-01-23: 30 mL via PERINEURAL

## 2016-01-23 MED ORDER — PROPOFOL 10 MG/ML IV BOLUS
INTRAVENOUS | Status: AC
  Filled 2016-01-23: qty 20

## 2016-01-23 MED ORDER — ONDANSETRON HCL 4 MG/2ML IJ SOLN: 4.0000 mg | Freq: Once | INTRAMUSCULAR | Status: DC | PRN

## 2016-01-23 MED ORDER — ONDANSETRON HCL 4 MG/2ML IJ SOLN
INTRAMUSCULAR | Status: DC | PRN
  Administered 2016-01-23: 4 mg via INTRAVENOUS

## 2016-01-23 MED ORDER — FENTANYL CITRATE (PF) 100 MCG/2ML IJ SOLN
INTRAMUSCULAR | Status: AC
  Filled 2016-01-23: qty 2

## 2016-01-23 MED ORDER — PHENYLEPHRINE 40 MCG/ML (10ML) SYRINGE FOR IV PUSH (FOR BLOOD PRESSURE SUPPORT)
PREFILLED_SYRINGE | INTRAVENOUS | Status: AC
  Filled 2016-01-23: qty 10

## 2016-01-23 MED ORDER — ALBUTEROL SULFATE (2.5 MG/3ML) 0.083% IN NEBU: 3.0000 mL | INHALATION_SOLUTION | Freq: Four times a day (QID) | RESPIRATORY_TRACT | Status: DC | PRN

## 2016-01-23 MED ORDER — FAMOTIDINE 20 MG PO TABS: 20.0000 mg | ORAL_TABLET | Freq: Every day | ORAL | Status: DC

## 2016-01-23 MED ORDER — SUGAMMADEX SODIUM 200 MG/2ML IV SOLN
INTRAVENOUS | Status: DC | PRN
  Administered 2016-01-23: 200 mg via INTRAVENOUS

## 2016-01-23 MED ORDER — HYDROMORPHONE HCL 1 MG/ML IJ SOLN: 0.5000 mg | INTRAMUSCULAR | Status: DC | PRN

## 2016-01-23 MED ORDER — ARTIFICIAL TEARS OP OINT
TOPICAL_OINTMENT | OPHTHALMIC | Status: DC | PRN
  Administered 2016-01-23: 1 via OPHTHALMIC

## 2016-01-23 SURGICAL SUPPLY — 51 items
BENZOIN TINCTURE PRP APPL 2/3 (GAUZE/BANDAGES/DRESSINGS) ×3 IMPLANT
BLADE SURG 15 STRL LF DISP TIS (BLADE) ×1 IMPLANT
BLADE SURG 15 STRL SS (BLADE) ×2
BLADE SURG ROTATE 9660 (MISCELLANEOUS) IMPLANT
CHLORAPREP W/TINT 26ML (MISCELLANEOUS) ×3 IMPLANT
CLOSURE WOUND 1/2 X4 (GAUZE/BANDAGES/DRESSINGS) ×1
COVER SURGICAL LIGHT HANDLE (MISCELLANEOUS) ×3 IMPLANT
DRAIN PENROSE 1/2X12 LTX STRL (WOUND CARE) ×3 IMPLANT
DRAPE LAPAROSCOPIC ABDOMINAL (DRAPES) IMPLANT
DRAPE LAPAROTOMY TRNSV 102X78 (DRAPE) ×3 IMPLANT
DRAPE UTILITY XL STRL (DRAPES) ×3 IMPLANT
DRSG TEGADERM 4X4.75 (GAUZE/BANDAGES/DRESSINGS) ×3 IMPLANT
ELECT CAUTERY BLADE 6.4 (BLADE) ×3 IMPLANT
ELECT REM PT RETURN 9FT ADLT (ELECTROSURGICAL) ×3
ELECTRODE REM PT RTRN 9FT ADLT (ELECTROSURGICAL) ×1 IMPLANT
GAUZE SPONGE 4X4 12PLY STRL (GAUZE/BANDAGES/DRESSINGS) ×3 IMPLANT
GAUZE SPONGE 4X4 16PLY XRAY LF (GAUZE/BANDAGES/DRESSINGS) ×3 IMPLANT
GLOVE BIO SURGEON STRL SZ 6.5 (GLOVE) ×2 IMPLANT
GLOVE BIO SURGEON STRL SZ7 (GLOVE) ×3 IMPLANT
GLOVE BIO SURGEONS STRL SZ 6.5 (GLOVE) ×1
GLOVE BIOGEL PI IND STRL 6.5 (GLOVE) ×1 IMPLANT
GLOVE BIOGEL PI IND STRL 7.0 (GLOVE) ×1 IMPLANT
GLOVE BIOGEL PI IND STRL 7.5 (GLOVE) ×1 IMPLANT
GLOVE BIOGEL PI INDICATOR 6.5 (GLOVE) ×2
GLOVE BIOGEL PI INDICATOR 7.0 (GLOVE) ×2
GLOVE BIOGEL PI INDICATOR 7.5 (GLOVE) ×2
GOWN STRL REUS W/ TWL LRG LVL3 (GOWN DISPOSABLE) ×2 IMPLANT
GOWN STRL REUS W/TWL LRG LVL3 (GOWN DISPOSABLE) ×4
KIT BASIN OR (CUSTOM PROCEDURE TRAY) ×3 IMPLANT
KIT ROOM TURNOVER OR (KITS) ×3 IMPLANT
MESH PARIETEX PROGRIP LEFT (Mesh General) ×3 IMPLANT
NEEDLE HYPO 25GX1X1/2 BEV (NEEDLE) ×3 IMPLANT
NS IRRIG 1000ML POUR BTL (IV SOLUTION) ×3 IMPLANT
PACK SURGICAL SETUP 50X90 (CUSTOM PROCEDURE TRAY) ×3 IMPLANT
PAD ARMBOARD 7.5X6 YLW CONV (MISCELLANEOUS) ×3 IMPLANT
PENCIL BUTTON HOLSTER BLD 10FT (ELECTRODE) ×3 IMPLANT
SPONGE INTESTINAL PEANUT (DISPOSABLE) ×3 IMPLANT
STRIP CLOSURE SKIN 1/2X4 (GAUZE/BANDAGES/DRESSINGS) ×2 IMPLANT
SUT MNCRL AB 4-0 PS2 18 (SUTURE) ×3 IMPLANT
SUT PDS AB 0 CT 36 (SUTURE) ×3 IMPLANT
SUT SILK 2 0 SH (SUTURE) IMPLANT
SUT SILK 3 0 (SUTURE) ×2
SUT SILK 3-0 18XBRD TIE 12 (SUTURE) ×1 IMPLANT
SUT VIC AB 0 CT2 27 (SUTURE) ×3 IMPLANT
SUT VIC AB 2-0 SH 27 (SUTURE) ×2
SUT VIC AB 2-0 SH 27X BRD (SUTURE) ×1 IMPLANT
SUT VIC AB 3-0 SH 27 (SUTURE) ×2
SUT VIC AB 3-0 SH 27XBRD (SUTURE) ×1 IMPLANT
SYR CONTROL 10ML LL (SYRINGE) ×3 IMPLANT
TOWEL OR 17X24 6PK STRL BLUE (TOWEL DISPOSABLE) ×3 IMPLANT
TOWEL OR 17X26 10 PK STRL BLUE (TOWEL DISPOSABLE) ×3 IMPLANT

## 2016-01-23 NOTE — Anesthesia Procedure Notes (Addendum)
Procedure Name: Intubation Date/Time: 01/23/2016 8:29 AM Performed by: Suzy Bouchard Pre-anesthesia Checklist: Patient identified, Emergency Drugs available, Suction available, Timeout performed and Patient being monitored Patient Re-evaluated:Patient Re-evaluated prior to inductionOxygen Delivery Method: Circle system utilized Preoxygenation: Pre-oxygenation with 100% oxygen Intubation Type: IV induction Ventilation: Mask ventilation without difficulty Laryngoscope Size: Miller and 2 Grade View: Grade I Tube type: Oral Laser Tube: Cuffed inflated with minimal occlusive pressure - saline Tube size: 7.5 mm Number of attempts: 1 Airway Equipment and Method: Stylet and Oral airway Placement Confirmation: ETT inserted through vocal cords under direct vision,  positive ETCO2 and breath sounds checked- equal and bilateral Secured at: 22 cm Tube secured with: Tape Dental Injury: Teeth and Oropharynx as per pre-operative assessment     Anesthesia Regional Block:  TAP block  Pre-Anesthetic Checklist: ,, timeout performed, Correct Patient, Correct Site, Correct Laterality, Correct Procedure, Correct Position, site marked, Risks and benefits discussed,  Surgical consent,  Pre-op evaluation,  At surgeon's request and post-op pain management  Laterality: Left  Prep: chloraprep       Needles:  Injection technique: Single-shot  Needle Type: Echogenic Stimulator Needle     Needle Length: 10cm 10 cm Needle Gauge: 21 and 21 G    Additional Needles:  Procedures: ultrasound guided (picture in chart) TAP block Narrative:  Injection made incrementally with aspirations every 5 mL.  Performed by: Personally  Anesthesiologist: Catalina Gravel  Additional Notes: No pain on injection. No increased resistance to injection. Injection made in 5cc increments.  Good needle visualization.  Patient tolerated procedure well.

## 2016-01-23 NOTE — Progress Notes (Signed)
Voided,Ancef to be given in OR. Report given prior to transport. Wife in pt's. room as it is difficult for her to walk.

## 2016-01-23 NOTE — Transfer of Care (Signed)
Immediate Anesthesia Transfer of Care Note  Patient: Ricardo Wilson.  Procedure(s) Performed: Procedure(s): LEFT HERNIA REPAIR INGUINAL INCARCERATED (Left)  Patient Location: PACU  Anesthesia Type:GA combined with regional for post-op pain  Level of Consciousness: awake, alert  and oriented  Airway & Oxygen Therapy: Patient Spontanous Breathing and Patient connected to nasal cannula oxygen  Post-op Assessment: Report given to RN, Post -op Vital signs reviewed and stable and Patient moving all extremities  Post vital signs: Reviewed and stable  Last Vitals:  Filed Vitals:   01/23/16 0242 01/23/16 0533  BP: 139/80 148/97  Pulse: 83 74  Temp: 36.6 C 36.7 C  Resp: 18 16    Last Pain:  Filed Vitals:   01/23/16 0534  PainSc: 4          Complications: No apparent anesthesia complications

## 2016-01-23 NOTE — H&P (Signed)
Ricardo Wilson. is an 75 y.o. male.   Chief Complaint: Left inguinal bulge and discomfort HPI: Patient had stood up from a squatting position, felt uncomfortable bulge in theleft groin.  Eventually diagnosed with an incarcerated left inguinal hernia with sigmoid colon on CT.  Surgery called for consultation and consideration of surgery.  I was able to get the hernia reduced completely, although as soon as he stood up the hernia came back out and was reducible but with some effort.  Past Medical History  Diagnosis Date  . COPD (chronic obstructive pulmonary disease) ([place])   . Arthritis   . GERD (gastroesophageal reflux disease)     Past Surgical History  Procedure Laterality Date  . Appendectomy    . Lung resection    . Rotator cuff repair Bilateral   . Colonoscopy  2013    tubular adenoma x2    Family History  Problem Relation Age of Onset  . Uterine cancer Mother   . Alcohol abuse Father   . Diabetes Father    Social History:  reports that he has quit smoking. He has never used smokeless tobacco. He reports that he drinks alcohol. He reports that he does not use illicit drugs.  Allergies:  Allergies  Allergen Reactions  . Aspirin Nausea And Vomiting    As a child     (Not in a hospital admission)  Results for orders placed or performed during the hospital encounter of 01/22/16 (from the past 48 hour(s))  Basic metabolic panel     Status: Abnormal   Collection Time: 01/22/16 11:41 PM  Result Value Ref Range   Sodium 139 135 - 145 mmol/L   Potassium 4.1 3.5 - 5.1 mmol/L   Chloride 109 101 - 111 mmol/L   CO2 25 22 - 32 mmol/L   Glucose, Bld 114 (H) 65 - 99 mg/dL   BUN 22 (H) 6 - 20 mg/dL   Creatinine, Ser 1.01 0.61 - 1.24 mg/dL   Calcium 9.1 8.9 - 10.3 mg/dL   GFR calc non Af Amer >60 >60 mL/min   GFR calc Af Amer >60 >60 mL/min    Comment: (NOTE) The eGFR has been calculated using the CKD EPI equation. This calculation has not been validated in all clinical  situations. eGFR's persistently <60 mL/min signify possible Chronic Kidney Disease.    Anion gap 5 5 - 15  CBC with Differential     Status: None   Collection Time: 01/22/16 11:41 PM  Result Value Ref Range   WBC 9.1 4.0 - 10.5 K/uL   RBC 4.85 4.22 - 5.81 MIL/uL   Hemoglobin 14.5 13.0 - 17.0 g/dL   HCT 42.8 39.0 - 52.0 %   MCV 88.2 78.0 - 100.0 fL   MCH 29.9 26.0 - 34.0 pg   MCHC 33.9 30.0 - 36.0 g/dL   RDW 14.1 11.5 - 15.5 %   Platelets 201 150 - 400 K/uL   Neutrophils Relative % 64 %   Neutro Abs 5.7 1.7 - 7.7 K/uL   Lymphocytes Relative 28 %   Lymphs Abs 2.6 0.7 - 4.0 K/uL   Monocytes Relative 6 %   Monocytes Absolute 0.6 0.1 - 1.0 K/uL   Eosinophils Relative 2 %   Eosinophils Absolute 0.2 0.0 - 0.7 K/uL   Basophils Relative 0 %   Basophils Absolute 0.0 0.0 - 0.1 K/uL  I-stat Chem 8, ED     Status: Abnormal   Collection Time: 01/22/16 11:46 PM  Result Value  Ref Range   Sodium 141 135 - 145 mmol/L   Potassium 4.2 3.5 - 5.1 mmol/L   Chloride 104 101 - 111 mmol/L   BUN 24 (H) 6 - 20 mg/dL   Creatinine, Ser 1.10 0.61 - 1.24 mg/dL   Glucose, Bld 112 (H) 65 - 99 mg/dL   Calcium, Ion 1.17 1.12 - 1.23 mmol/L   TCO2 25 0 - 100 mmol/L   Hemoglobin 13.9 13.0 - 17.0 g/dL   HCT 41.0 39.0 - 52.0 %   Ct Pelvis W Contrast  01/23/2016  CLINICAL DATA:  Left lower quadrant hernia, onset today.  Pain. EXAM: CT PELVIS WITH CONTRAST TECHNIQUE: Multidetector CT imaging of the pelvis was performed using the standard protocol following the bolus administration of intravenous contrast. CONTRAST:  171m ISOVUE-300 IOPAMIDOL (ISOVUE-300) INJECTION 61% COMPARISON:  None. FINDINGS: There is a moderate size left inguinal hernia containing sigmoid colon. No proximal colonic obstruction. Diverticulosis of the visualized colon. Appendix is surgically absent. No evidence of diverticulitis. Prostate gland is not enlarged. Bladder wall is not thickened. No pelvic mass or lymphadenopathy. No destructive bone  lesions. IMPRESSION: Moderate size left inguinal hernia containing sigmoid colon. No proximal colonic obstruction. Electronically Signed   By: WLucienne CapersM.D.   On: 01/23/2016 00:29    Review of Systems  Constitutional: Negative for fever and chills.  Cardiovascular: Negative.   Gastrointestinal: Negative for nausea and vomiting.       Left inguinal pain.  All other systems reviewed and are negative.   Blood pressure 131/66, pulse 84, temperature 97.9 F (36.6 C), temperature source Oral, resp. rate 16, SpO2 97 %. Physical Exam  Constitutional: He is oriented to person, place, and time. He appears well-developed and well-nourished.  HENT:  Head: Normocephalic and atraumatic.  Right Ear: External ear normal.  Left Ear: External ear normal.  Eyes: Conjunctivae and EOM are normal. Pupils are equal, round, and reactive to light.  Neck: Normal range of motion. Neck supple.  Cardiovascular: Normal rate, regular rhythm, normal heart sounds and intact distal pulses.   Respiratory: Effort normal and breath sounds normal.  History of COPD  GI: Soft. Normal appearance and bowel sounds are normal. There is no tenderness. There is no rigidity and no rebound. A hernia is present. Hernia confirmed positive in the left inguinal area (rreducible with moderate effort).  Musculoskeletal: Normal range of motion.  Neurological: He is alert and oriented to person, place, and time. He has normal reflexes.  Skin: Skin is warm and dry.  Psychiatric: He has a normal mood and affect. His behavior is normal. Judgment and thought content normal.     Assessment/Plan Intermittently incarcerated left inguinal hernia containing sigmoid colon based on CT. I was able to get the hernia completely reduced with some moderate effort, but my concern is that the patient would not be able to do so after being released, and this could become incarceration with strangulation.  Will admit the patient for hydration and  surgery tomorrow AM (7/16)  Discussed with the family.  JJudeth Horn MD 01/23/2016, 1:23 AM

## 2016-01-23 NOTE — Op Note (Signed)
Hernia, Open, Procedure Note  Indications: The patient presented with a history of a left, not reducible inguinal hernia.    Pre-operative Diagnosis: left not reducible inguinal hernia Post-operative Diagnosis: same  Surgeon: Maia Petties.   Assistants: none  Anesthesia: General LMA anesthesia/ TAP BLOCK  ASA Class: 2  Procedure Details  The patient was seen again in the Holding Room. The risks, benefits, complications, treatment options, and expected outcomes were discussed with the patient. The possibilities of reaction to medication, pulmonary aspiration, perforation of viscus, bleeding, recurrent infection, the need for additional procedures, and development of a complication requiring transfusion or further operation were discussed with the patient and/or family. The likelihood of success in repairing the hernia and returning the patient to their previous functional status is good.  There was concurrence with the proposed plan, and informed consent was obtained. The site of surgery was properly noted/marked. The patient was taken to the Operating Room, identified as Ricardo Wilson., and the procedure verified as left inguinal hernia repair. A Time Out was held and the above information confirmed.  The patient was placed in the supine position and underwent induction of anesthesia. The lower abdomen and groin was prepped with Chloraprep and draped in the standard fashion, and 0.25% Marcaine with epinephrine was used to anesthetize the skin over the mid-portion of the inguinal canal. An oblique incision was made. Dissection was carried down through the subcutaneous tissue with cautery to the external oblique fascia.  We opened the external oblique fascia along the direction of its fibers to the external ring.  The spermatic cord was circumferentially dissected bluntly and retracted with a Penrose drain.  The ilioinguinal nerve was identified and preserved.  The floor of the inguinal canal  was inspected and was intact.  We skeletonized the spermatic cord and reduced a large indirect hernia.  We used a left Progrip mesh which was inserted and deployed across the floor of the inguinal canal. The mesh was tucked underneath the external oblique fascia laterally.  The flap of the mesh was closed around the spermatic cord to recreate the internal inguinal ring.  The mesh was secured to the pubic tubercle with 0 Vicryl.  The inferior edge of the mesh was secured to the shelving edge with 0 Vicryl.  The flap of the mesh was closed with 0 Vicryl.  The external oblique fascia was reapproximated with 2-0 Vicryl.  3-0 Vicryl was used to close the subcutaneous tissues and 4-0 Monocryl was used to close the skin in subcuticular fashion.  Benzoin and steri-strips were used to seal the incision.  A clean dressing was applied.  The patient was then extubated and brought to the recovery room in stable condition.  All sponge, instrument, and needle counts were correct prior to closure and at the conclusion of the case.  Estimated Blood Loss: Minimal           Complications: None; patient tolerated the procedure well.         Disposition: PACU - hemodynamically stable.         Condition: stable  Ricardo Wilson. Ricardo Dover, MD, [facility] Surgery  General/ Trauma Surgery  01/23/2016 9:41 AM

## 2016-01-23 NOTE — Anesthesia Preprocedure Evaluation (Addendum)
Anesthesia Evaluation  Patient identified by MRN, date of birth, ID band Patient awake    Reviewed: Allergy & Precautions, NPO status , Patient's Chart, lab work & pertinent test results  Airway Mallampati: II  TM Distance: >3 FB Neck ROM: Full    Dental  (+) Dental Advisory Given, Edentulous Upper, Missing,    Pulmonary COPD,  COPD inhaler, former smoker,  S/p lung reduction surgery 1998   Pulmonary exam normal breath sounds clear to auscultation       Cardiovascular Exercise Tolerance: Good Normal cardiovascular exam Rhythm:Regular Rate:Normal     Neuro/Psych negative neurological ROS     GI/Hepatic Neg liver ROS, GERD  Medicated,incarcerated inguinal hernia    Endo/Other  negative endocrine ROS  Renal/GU negative Renal ROS     Musculoskeletal  (+) Arthritis , Osteoarthritis,    Abdominal   Peds  Hematology negative hematology ROS (+)   Anesthesia Other Findings Day of surgery medications reviewed with the patient.  Reproductive/Obstetrics                            Anesthesia Physical Anesthesia Plan  ASA: III  Anesthesia Plan: General and Regional   Post-op Pain Management: GA combined w/ Regional for post-op pain   Induction: Intravenous  Airway Management Planned: Oral ETT  Additional Equipment:   Intra-op Plan:   Post-operative Plan: Extubation in OR  Informed Consent: I have reviewed the patients History and Physical, chart, labs and discussed the procedure including the risks, benefits and alternatives for the proposed anesthesia with the patient or authorized representative who has indicated his/her understanding and acceptance.   Dental advisory given  Plan Discussed with: CRNA  Anesthesia Plan Comments: (Risks/benefits of general anesthesia discussed with patient including risk of damage to teeth, lips, gum, and tongue, nausea/vomiting, allergic reactions to  medications, and the possibility of heart attack, stroke and death.  All patient questions answered.  Patient wishes to proceed.)       Anesthesia Quick Evaluation

## 2016-01-23 NOTE — Progress Notes (Signed)
Patient ID: Ricardo Wilson., male   DOB: 19-Feb-1941, 75 y.o.   MRN: [id number]  Left inguinal hernia - slightly protruding; non-tender at this time.  Discussed left inguinal hernia repair with mesh.  The surgical procedure has been discussed with the patient.  Potential risks, benefits, alternative treatments, and expected outcomes have been explained.  All of the patient's questions at this time have been answered.  The likelihood of reaching the patient's treatment goal is good.  The patient understand the proposed surgical procedure and wishes to proceed.  Imogene Burn. Georgette Dover, MD, [facility] Surgery  General/ Trauma Surgery  01/23/2016 7:41 AM

## 2016-01-23 NOTE — Anesthesia Postprocedure Evaluation (Signed)
Anesthesia Post Note  Patient: Ricardo Wilson.  Procedure(s) Performed: Procedure(s) (LRB): LEFT HERNIA REPAIR INGUINAL INCARCERATED (Left)  Patient location during evaluation: PACU Anesthesia Type: General and Regional Level of consciousness: awake and alert Pain management: pain level controlled Vital Signs Assessment: post-procedure vital signs reviewed and stable Respiratory status: spontaneous breathing, nonlabored ventilation, respiratory function stable and patient connected to nasal cannula oxygen Cardiovascular status: blood pressure returned to baseline and stable Postop Assessment: no signs of nausea or vomiting Anesthetic complications: no    Last Vitals:  Filed Vitals:   01/23/16 1004 01/23/16 1021  BP: 160/83 142/74  Pulse: 93 103  Temp: 36.7 C 36.8 C  Resp: 11 16    Last Pain:  Filed Vitals:   01/23/16 1405  PainSc: 2                  Catalina Gravel

## 2016-01-24 ENCOUNTER — Encounter (HOSPITAL_COMMUNITY): Payer: Self-pay | Admitting: Surgery

## 2016-01-24 MED ORDER — DOCUSATE SODIUM 100 MG PO CAPS: 100.0000 mg | ORAL_CAPSULE | Freq: Two times a day (BID) | ORAL | Status: DC | PRN

## 2016-01-24 MED ORDER — OXYCODONE-ACETAMINOPHEN 5-325 MG PO TABS: 1.0000 | ORAL_TABLET | ORAL | Status: DC | PRN

## 2016-01-24 MED ORDER — POLYETHYLENE GLYCOL 3350 17 G PO PACK: 17.0000 g | PACK | Freq: Every day | ORAL | Status: DC | PRN

## 2016-01-24 NOTE — Discharge Summary (Signed)
[facility] Discharge Summary   Patient ID: Ricardo Wilson. MRN: [id number] DOB/AGE: 74-Dec-1942 74 y.o.  Admit date: 01/22/2016 Discharge date: 01/24/2016  Admitting Diagnosis: Incarcerated left inguinal hernia  Discharge Diagnosis Patient Active Problem List   Diagnosis Date Noted  . Incarcerated left inguinal hernia 01/23/2016  . Laceration of second finger, right 06/02/2014  . Solitary pulmonary nodule 08/26/2013  . Diarrhea 08/07/2013  . COPD (chronic obstructive pulmonary disease) ([place]) 08/07/2013    Consultants None   Imaging: Ct Pelvis W Contrast  01/23/2016  CLINICAL DATA:  Left lower quadrant hernia, onset today.  Pain. EXAM: CT PELVIS WITH CONTRAST TECHNIQUE: Multidetector CT imaging of the pelvis was performed using the standard protocol following the bolus administration of intravenous contrast. CONTRAST:  11mL ISOVUE-300 IOPAMIDOL (ISOVUE-300) INJECTION 61% COMPARISON:  None. FINDINGS: There is a moderate size left inguinal hernia containing sigmoid colon. No proximal colonic obstruction. Diverticulosis of the visualized colon. Appendix is surgically absent. No evidence of diverticulitis. Prostate gland is not enlarged. Bladder wall is not thickened. No pelvic mass or lymphadenopathy. No destructive bone lesions. IMPRESSION: Moderate size left inguinal hernia containing sigmoid colon. No proximal colonic obstruction. Electronically Signed   By: Ricardo Wilson M.D.   On: 01/23/2016 00:29    Procedures Dr. Donnie Wilson (01/23/16) - open left inguinal hernia repair with progrip mesh   Hospital Course:  75 year-old male who presented to [facility] with left inguinal pain and swelling after standing from a squatting position. CT scan significant for above findings and surgery was asked to consult. At bedside the left inguinal hernia was reducible but recurred with standing.  Patient was admitted and underwent procedure listed above.  Tolerated procedure well and  was transferred to the floor.  Diet was advanced as tolerated.  On POD#1, the patient was voiding well, tolerating diet, ambulating well, pain well controlled, vital signs stable, incisions c/d/i and felt stable for discharge home.  Patient will follow up in our office in 3 weeks with Dr. Georgette Wilson and knows to call with questions or concerns.   Physical Exam: General:  Alert, NAD, pleasant, comfortable Abd:  Soft, NT/ND, +BS Skin: left inguinal incision with steri-strips in place, no drainage, tender around incision site. No erythema or purulent drainage     Medication List    TAKE these medications        albuterol 108 (90 Base) MCG/ACT inhaler  Commonly known as:  PROVENTIL HFA;VENTOLIN HFA  Inhale 1-2 puffs into the lungs every 6 (six) hours as needed for wheezing or shortness of breath. Please fill either proair HFA or ventolin, whichever is cheaper     celecoxib 200 MG capsule  Commonly known as:  CELEBREX  take 1 capsule by mouth once daily     docusate sodium 100 MG capsule  Commonly known as:  COLACE  Take 1 capsule (100 mg total) by mouth 2 (two) times daily as needed for mild constipation.     oxyCODONE-acetaminophen 5-325 MG tablet  Commonly known as:  PERCOCET/ROXICET  Take 1-2 tablets by mouth every 4 (four) hours as needed for moderate pain.     polyethylene glycol packet  Commonly known as:  MIRALAX / GLYCOLAX  Take 17 g by mouth daily as needed for mild constipation.     ranitidine 300 MG tablet  Commonly known as:  ZANTAC  Take 1 tablet (300 mg total) by mouth 2 (two) times daily.     tiotropium 18 MCG inhalation capsule  Commonly known  as:  SPIRIVA  Place 1 capsule (18 mcg total) into inhaler and inhale daily.            Follow-up Information    Follow up with Ricardo Wilson., MD. Ricardo Wilson on 02/14/2016.   Specialty:  General Surgery   Why:  For post-operative follow up. Your appointment is at 9:00 AM - please arrive 30 minutes early to get checked in and fill  out any necessary paperwork.    Contact information:   [address] [place] [address] [phone]       Signed: Obie Wilson, [facility] 01/24/2016, 2:25 PM Pager: [phone] Consults: [phone] Mon-Fri 7:00 am-4:30 pm Sat-Sun 7:00 am-11:30 am

## 2016-01-24 NOTE — Progress Notes (Signed)
Discharge instructions reviewed with pt and pt's wife and prescription given.  Pt and pt's wife verbalized understanding and had no questions.  Pt discharged in stable condition via wheelchair with wife.  Ricardo Wilson

## 2016-01-24 NOTE — Discharge Instructions (Signed)
[facility], [place]  INGUINAL HERNIA REPAIR: POST OP INSTRUCTIONS  Always review your discharge instruction sheet given to you by the facility where your surgery was performed. IF YOU HAVE DISABILITY OR FAMILY LEAVE FORMS, YOU MUST BRING THEM TO THE OFFICE FOR PROCESSING.   DO NOT GIVE THEM TO YOUR DOCTOR.  1. A  prescription for pain medication may be given to you upon discharge.  Take your pain medication as prescribed, if needed.  If narcotic pain medicine is not needed, then you may take acetaminophen (Tylenol) or ibuprofen (Advil) as needed. 2. Take your usually prescribed medications unless otherwise directed. 3. If you need a refill on your pain medication, please contact your pharmacy.  They will contact our office to request authorization. Prescriptions will not be filled after 5 pm or on week-ends. 4. You should follow a light diet the first 24 hours after arrival home, such as soup and crackers, etc.  Be sure to include lots of fluids daily.  Resume your normal diet the day after surgery. 5. Most patients will experience some swelling and bruising around the umbilicus or in the groin and scrotum.  Ice packs and reclining will help.  Swelling and bruising can take several days to resolve.  6. It is common to experience some constipation if taking pain medication after surgery.  Increasing fluid intake and taking a stool softener (such as Colace) will usually help or prevent this problem from occurring.  A mild laxative (Milk of Magnesia or Miralax) should be taken according to package directions if there are no bowel movements after 48 hours. 7. Unless discharge instructions indicate otherwise, you may remove your bandages 24-48 hours after surgery, and you may shower at that time.  You will have steri-strips (small skin tapes) in place directly over the incision.  These strips should be left on the skin for 7-10 days. 8. ACTIVITIES:  You may resume regular (light) daily activities  beginning the next day--such as daily self-care, walking, climbing stairs--gradually increasing activities as tolerated.  You may have sexual intercourse when it is comfortable.  Refrain from any heavy lifting or straining until approved by your doctor. a. You may drive when you are no longer taking prescription pain medication, you can comfortably wear a seatbelt, and you can safely maneuver your car and apply brakes. b. RETURN TO WORK:  2-3 weeks with light duty - no lifting over 15 lbs. 9. You should see your doctor in the office for a follow-up appointment approximately 2-3 weeks after your surgery.  Make sure that you call for this appointment within a day or two after you arrive home to insure a convenient appointment time. 10. OTHER INSTRUCTIONS:  __________________________________________________________________________________________________________________________________________________________________________________________  WHEN TO CALL YOUR DOCTOR: 1. Fever over 101.0 2. Inability to urinate 3. Nausea and/or vomiting 4. Extreme swelling or bruising 5. Continued bleeding from incision. 6. Increased pain, redness, or drainage from the incision  The clinic staff is available to answer your questions during regular business hours.  Please dont hesitate to call and ask to speak to one of the nurses for clinical concerns.  If you have a medical emergency, go to the nearest emergency room or call 911.  A surgeon from [facility] is always on call at the hospital   [address] [place], [place], [place]  [address] ?  [address], [place], [place]   [address] [phone]    FAX [phone] Web site: [email or web address]  Open Hernia Repair, Care After These instructions give you information about caring for yourself after your procedure. Your doctor may also give you more specific instructions. Call your doctor if you have any  problems or questions after your procedure. HOME CARE  Keep the cut (incision) area clean and dry. You may gently wash the incision area with soap and water 48 hours after surgery. To dry the incision area, gently blot or dab it.  Do not take baths, swim, or use a hot tub for 10 days or until your doctor approves.  Change bandages (dressings) as told by your doctor.  Check your incision area every day for signs of infection. Watch for:  Redness, swelling, or pain.  Fluid , blood, or pus.  Eat plenty of fruits and vegetables. This helps to prevent constipation.  Drink enough fluid to keep your pee (urine) clear or pale yellow. This also helps to prevent constipation.  Do not drive or operate heavy machinery until your doctor says it is okay.  Do not lift anything that is heavier than 10 lb (4.5 kg) until your doctor approves.  Do not play contact sports for 4 weeks or until your doctor approves.  Take medicines only as told by your doctor.  Keep all follow-up visits as told by your doctor. This is important. Ask your doctor when to make an appointment to have your stitches (sutures) or staples removed. GET HELP IF:  The incision is bleeding more than before.  You have blood in your poop (stool).  The incision hurts more than before.  You have redness, swelling, or pain in your incision area.  You have fluid, blood, or pus coming from your incision.  You have a fever.  You notice a bad smell coming from the incision area or the dressing. GET HELP RIGHT AWAY IF:  You have a rash.  Your chest hurts.  You are short of breath.  You feel light-headed.  You feel weak and dizzy (feel faint).   This information is not intended to replace advice given to you by your health care provider. Make sure you discuss any questions you have with your health care provider.   Document Released: 07/17/2014 Document Reviewed: 07/17/2014 Elsevier Interactive Patient Education [employer]

## 2016-03-02 ENCOUNTER — Encounter: Payer: Medicare HMO | Admitting: Family Medicine

## 2016-03-24 ENCOUNTER — Encounter: Payer: Self-pay | Admitting: Family Medicine

## 2016-03-24 ENCOUNTER — Ambulatory Visit (INDEPENDENT_AMBULATORY_CARE_PROVIDER_SITE_OTHER): Payer: Commercial Managed Care - HMO | Admitting: Family Medicine

## 2016-03-24 VITALS — BP 126/76 | HR 74 | Temp 97.9°F | Ht 67.0 in | Wt 182.5 lb

## 2016-03-24 DIAGNOSIS — M1990 Unspecified osteoarthritis, unspecified site: Secondary | ICD-10-CM

## 2016-03-24 DIAGNOSIS — R200 Anesthesia of skin: Secondary | ICD-10-CM

## 2016-03-24 DIAGNOSIS — R7989 Other specified abnormal findings of blood chemistry: Secondary | ICD-10-CM | POA: Insufficient documentation

## 2016-03-24 DIAGNOSIS — J449 Chronic obstructive pulmonary disease, unspecified: Secondary | ICD-10-CM

## 2016-03-24 DIAGNOSIS — Z7189 Other specified counseling: Secondary | ICD-10-CM | POA: Insufficient documentation

## 2016-03-24 DIAGNOSIS — R946 Abnormal results of thyroid function studies: Secondary | ICD-10-CM

## 2016-03-24 DIAGNOSIS — Z23 Encounter for immunization: Secondary | ICD-10-CM | POA: Diagnosis not present

## 2016-03-24 DIAGNOSIS — Z0000 Encounter for general adult medical examination without abnormal findings: Secondary | ICD-10-CM | POA: Insufficient documentation

## 2016-03-24 DIAGNOSIS — M79641 Pain in right hand: Secondary | ICD-10-CM

## 2016-03-24 DIAGNOSIS — M79643 Pain in unspecified hand: Secondary | ICD-10-CM | POA: Insufficient documentation

## 2016-03-24 LAB — BASIC METABOLIC PANEL
BUN: 18 mg/dL (ref 6–23)
CO2: 28 mEq/L (ref 19–32)
Calcium: 9.4 mg/dL (ref 8.4–10.5)
Chloride: 106 mEq/L (ref 96–112)
Creatinine, Ser: 1.1 mg/dL (ref 0.40–1.50)
GFR: 69.5 mL/min (ref >60.00)
Glucose, Bld: 93 mg/dL (ref 70–99)
Potassium: 4.4 mEq/L (ref 3.5–5.1)
Sodium: 142 mEq/L (ref 135–145)

## 2016-03-24 LAB — TSH: TSH: 3.55 u[IU]/mL (ref 0.35–4.50)

## 2016-03-24 MED ORDER — ALBUTEROL SULFATE HFA 108 (90 BASE) MCG/ACT IN AERS: 1.0000 | INHALATION_SPRAY | Freq: Four times a day (QID) | RESPIRATORY_TRACT | 5 refills | Status: DC | PRN

## 2016-03-24 MED ORDER — TIOTROPIUM BROMIDE MONOHYDRATE 18 MCG IN CAPS: 18.0000 ug | ORAL_CAPSULE | Freq: Every day | RESPIRATORY_TRACT | 3 refills | Status: DC

## 2016-03-24 MED ORDER — CELECOXIB 200 MG PO CAPS: 200.0000 mg | ORAL_CAPSULE | Freq: Every day | ORAL | 3 refills | Status: DC

## 2016-03-24 NOTE — Assessment & Plan Note (Signed)
Flu today Shingles d/w pt.  PNA UTD Tetanus UTD Colonoscopy 2013 Prostate cancer screening and PSA options (with potential risks and benefits of testing vs not testing) were discussed along with recent recs/guidelines.  He declined testing PSA at this point. Minimal LUTS, not bothersome enough to tx now per patient report.   Advance directive- wife designated if patient were incapactated.   Cognitive function addressed- see scanned forms- and if abnormal then additional documentation follows.

## 2016-03-24 NOTE — Assessment & Plan Note (Signed)
R>L hand pain, not just the numbness noted from likely CTS.  D/w pt.  Presumed OA, d/w pt.  Restart celebrex, check bmet today given the nsaid tx.

## 2016-03-24 NOTE — Patient Instructions (Addendum)
Check with your insurance to see if they will cover the shingles shot. Go to the lab on the way out.  We'll contact you with your lab report. Get a wrist splint and try sleeping in it.  That should help.  Take care.  Glad to see you.

## 2016-03-24 NOTE — Progress Notes (Signed)
I have personally reviewed the Medicare Annual Wellness questionnaire and have noted 1. The patient's medical and social history 2. Their use of alcohol, tobacco or illicit drugs 3. Their current medications and supplements 4. The patient's functional ability including ADL's, fall risks, home safety risks and hearing or visual             impairment. 5. Diet and physical activities 6. Evidence for depression or mood disorders  The patients weight, height, BMI have been recorded in the chart and visual acuity is per eye clinic.  I have made referrals, counseling and provided education to the patient based review of the above and I have provided the pt with a written personalized care plan for preventive services.  Provider list updated- see scanned forms.  Routine anticipatory guidance given to patient.  See health maintenance.  Flu today Shingles d/w pt.  PNA UTD Tetanus UTD Colonoscopy 2013 Prostate cancer screening and PSA options (with potential risks and benefits of testing vs not testing) were discussed along with recent recs/guidelines.  He declined testing PSA at this point. Minimal LUTS, not bothersome enough to tx now per patient report.   Advance directive- wife designated if patient were incapactated.   Cognitive function addressed- see scanned forms- and if abnormal then additional documentation follows.   H/o abnormal TSH.  Not on tx.  Reasonable to recheck TSH.  D/w pt.    R hand pain.  Off celebrex.  Diffuse joint aches in the hand.  Not acute pain.   Numbness in hands at night.  Usually at night or with prolonged driving during the day.  Better when he gets up from sleeping.  No weakness.    COPD.  Inhaler use usually only in the winter.  No ADE on meds.  Compliant in the winter when needed. Not SOB o/w.    PMH and SH reviewed  Meds, vitals, and allergies reviewed.   ROS: Per HPI.  Unless specifically indicated otherwise in HPI, the patient denies:  General:  fever. Eyes: acute vision changes ENT: sore throat Cardiovascular: chest pain Respiratory: SOB GI: vomiting GU: dysuria Musculoskeletal: acute back pain Derm: acute rash Neuro: acute motor dysfunction Psych: worsening mood Endocrine: polydipsia Heme: bleeding Allergy: hayfever  GEN: nad, alert and oriented HEENT: mucous membranes moist NECK: supple w/o LA CV: rrr. PULM: ctab, no inc wob ABD: soft, +bs EXT: no edema SKIN: no acute rash

## 2016-03-24 NOTE — Assessment & Plan Note (Signed)
phalen pos, no weakness, B sx, likely CTS.  D/w pt.  Would try night splint and update me as needed.

## 2016-03-24 NOTE — Assessment & Plan Note (Signed)
No sx now, can restart inhalers in the wintertime.  Flu shot today.  PNA vaccine up to date.

## 2016-03-24 NOTE — Assessment & Plan Note (Signed)
No tmg, recheck tsh pending.

## 2016-03-24 NOTE — Progress Notes (Signed)
Pre visit review using our clinic review tool, if applicable. No additional management support is needed unless otherwise documented below in the visit note. 

## 2016-03-24 NOTE — Assessment & Plan Note (Signed)
Advance directive- wife designated if patient were incapactated.

## 2016-03-27 ENCOUNTER — Encounter: Payer: Self-pay | Admitting: *Deleted

## 2016-07-25 ENCOUNTER — Other Ambulatory Visit: Payer: Self-pay | Admitting: Family Medicine

## 2016-09-07 DIAGNOSIS — H903 Sensorineural hearing loss, bilateral: Secondary | ICD-10-CM | POA: Diagnosis not present

## 2017-03-16 ENCOUNTER — Other Ambulatory Visit: Payer: Self-pay | Admitting: Family Medicine

## 2017-03-30 ENCOUNTER — Ambulatory Visit (INDEPENDENT_AMBULATORY_CARE_PROVIDER_SITE_OTHER): Payer: Medicare HMO | Admitting: Family Medicine

## 2017-03-30 ENCOUNTER — Encounter: Payer: Self-pay | Admitting: Family Medicine

## 2017-03-30 VITALS — BP 140/80 | HR 68 | Temp 97.8°F | Ht 67.0 in | Wt 185.5 lb

## 2017-03-30 DIAGNOSIS — Z23 Encounter for immunization: Secondary | ICD-10-CM | POA: Diagnosis not present

## 2017-03-30 DIAGNOSIS — R946 Abnormal results of thyroid function studies: Secondary | ICD-10-CM

## 2017-03-30 DIAGNOSIS — R609 Edema, unspecified: Secondary | ICD-10-CM

## 2017-03-30 DIAGNOSIS — Z0000 Encounter for general adult medical examination without abnormal findings: Secondary | ICD-10-CM | POA: Diagnosis not present

## 2017-03-30 DIAGNOSIS — R7989 Other specified abnormal findings of blood chemistry: Secondary | ICD-10-CM

## 2017-03-30 DIAGNOSIS — R200 Anesthesia of skin: Secondary | ICD-10-CM

## 2017-03-30 DIAGNOSIS — M79641 Pain in right hand: Secondary | ICD-10-CM

## 2017-03-30 DIAGNOSIS — Z7189 Other specified counseling: Secondary | ICD-10-CM

## 2017-03-30 DIAGNOSIS — J449 Chronic obstructive pulmonary disease, unspecified: Secondary | ICD-10-CM

## 2017-03-30 LAB — TSH: TSH: 3.87 u[IU]/mL (ref 0.35–4.50)

## 2017-03-30 LAB — BASIC METABOLIC PANEL
BUN: 21 mg/dL (ref 6–23)
CO2: 28 mEq/L (ref 19–32)
Calcium: 9.6 mg/dL (ref 8.4–10.5)
Chloride: 105 mEq/L (ref 96–112)
Creatinine, Ser: 1.06 mg/dL (ref 0.40–1.50)
GFR: 72.33 mL/min (ref >60.00)
Glucose, Bld: 105 mg/dL — ABNORMAL HIGH (ref 70–99)
Potassium: 4.7 mEq/L (ref 3.5–5.1)
Sodium: 139 mEq/L (ref 135–145)

## 2017-03-30 MED ORDER — RANITIDINE HCL 300 MG PO TABS: 300.0000 mg | ORAL_TABLET | Freq: Two times a day (BID) | ORAL | 3 refills | Status: DC

## 2017-03-30 MED ORDER — CELECOXIB 200 MG PO CAPS: 200.0000 mg | ORAL_CAPSULE | Freq: Every day | ORAL | 3 refills | Status: DC

## 2017-03-30 MED ORDER — ALBUTEROL SULFATE HFA 108 (90 BASE) MCG/ACT IN AERS: 1.0000 | INHALATION_SPRAY | Freq: Four times a day (QID) | RESPIRATORY_TRACT | 5 refills | Status: DC | PRN

## 2017-03-30 MED ORDER — TIOTROPIUM BROMIDE MONOHYDRATE 18 MCG IN CAPS: 18.0000 ug | ORAL_CAPSULE | Freq: Every day | RESPIRATORY_TRACT | 3 refills | Status: DC

## 2017-03-30 NOTE — Patient Instructions (Addendum)
Check with your insurance to see if they will cover the shingrix shot. Go to the lab on the way out.  We'll contact you with your lab report. Get a wrist splint and try sleeping in it.  That should help.  Call about an eye exam.   If you don't get a note/call from GI by the end of the year then let me know.   If the swelling gets worse then let me know.   Take care.  Glad to see you.

## 2017-03-30 NOTE — Progress Notes (Signed)
I have personally reviewed the Medicare Annual Wellness questionnaire and have noted 1. The patient's medical and social history 2. Their use of alcohol, tobacco or illicit drugs 3. Their current medications and supplements 4. The patient's functional ability including ADL's, fall risks, home safety risks and hearing or visual             impairment. 5. Diet and physical activities 6. Evidence for depression or mood disorders  The patients weight, height, BMI have been recorded in the chart and visual acuity is per eye clinic.  I have made referrals, counseling and provided education to the patient based review of the above and I have provided the pt with a written personalized care plan for preventive services.  Provider list updated- see scanned forms.  Routine anticipatory guidance given to patient.  See health maintenance. The possibility exists that previously documented standard health maintenance information may have been brought forward from a previous encounter into this note.  If needed, that same information has been updated to reflect the current situation based on today's encounter.    Flu today Shingles d/w pt.  See AVS.   PNA UTD Tetanus UTD Colonoscopy 2013.  Due for f/u 06/2017.   Prostate cancer screening and PSA options (with potential risks and benefits of testing vs not testing) were discussed along with recent recs/guidelines.  He declined testing PSA at this point. Minimal LUTS, variable but not bothersome enough to tx now per patient report.  He'll update me if more bothersome.   Advance directive- either son equally designated if patient were incapactated.   Cognitive function addressed- see scanned forms- and if abnormal then additional documentation follows.   His wife noted some occ BLE edema.  He is up and active a lot, on his feet most of the day.  No CP.    H/o abnormal TSH- prev noted, normal last year.  Not on tx.  Reasonable to recheck TSH.  D/w pt.      R hand pain.  Still on celebrex.  Diffuse joint aches in the hand.  Not acute pain.    Numbness in hands at night.  Usually at night or with prolonged driving during the day.  Better when he gets up from sleeping.  No weakness.  Prev d/w pt about splinting.    COPD.  Inhaler use usually only in the fall though winter.  No ADE on meds.  Compliant in seasons when needed. Not SOB o/w unless with expected (high) level of exertion- hauling a heavy trash can up a steep hill, etc.    PMH and SH reviewed  Meds, vitals, and allergies reviewed.   ROS: Per HPI.  Unless specifically indicated otherwise in HPI, the patient denies:  General: fever. Eyes: acute vision changes ENT: sore throat Cardiovascular: chest pain Respiratory: SOB GI: vomiting GU: dysuria Musculoskeletal: acute back pain Derm: acute rash Neuro: acute motor dysfunction Psych: worsening mood Endocrine: polydipsia Heme: bleeding Allergy: hayfever  GEN: nad, alert and oriented HEENT: mucous membranes moist NECK: supple w/o LA CV: rrr. PULM: ctab, no inc wob ABD: soft, +bs EXT: no edema Normal grip but tinel positive B wrists.   SKIN: no acute rash

## 2017-04-01 NOTE — Assessment & Plan Note (Addendum)
Flu today Shingles d/w pt.  See AVS.   PNA UTD Tetanus UTD Colonoscopy 2013.  Due for f/u 06/2017.   Prostate cancer screening and PSA options (with potential risks and benefits of testing vs not testing) were discussed along with recent recs/guidelines.  He declined testing PSA at this point. Minimal LUTS, variable but not bothersome enough to tx now per patient report.  He'll update me if more bothersome.   Advance directive- either son equally designated if patient were incapactated.   Cognitive function addressed- see scanned forms- and if abnormal then additional documentation follows.   No edema on exam today but if he has significant changes then he'll let me know.

## 2017-04-01 NOTE — Assessment & Plan Note (Signed)
History of. No thyromegaly on exam. Recheck TSH today.

## 2017-04-01 NOTE — Assessment & Plan Note (Signed)
Likely carpal tunnel symptoms. Tinel positive. Try splinting at night and update me if needed. He had not tried splinting it.

## 2017-04-01 NOTE — Assessment & Plan Note (Signed)
Advance directive- either son equally designated if patient were incapactated.   

## 2017-04-01 NOTE — Assessment & Plan Note (Signed)
Likely arthritis. Reasonable to continue Celebrex. Recheck creatinine today.

## 2017-04-01 NOTE — Assessment & Plan Note (Signed)
He doesn't need his inhalers in the summertime. He is getting ready to restart for the fall/winter, per his routine. Update me as needed. He does not have unexpected shortness of breath. He has the amount of shortness of breath I would expect for a 76 year old man when he is exerting himself, as when he is hauling a heavy trash can up a steep grade. If this is getting worse he can let me know. Lungs are clear on exam.

## 2017-07-16 ENCOUNTER — Other Ambulatory Visit: Payer: Self-pay | Admitting: Family Medicine

## 2017-07-16 NOTE — Telephone Encounter (Signed)
Pt has available refills at [facility] on [address] pt has not contacted pharmacy yet. Pt will call [facility] for refills. Nothing further needed.

## 2017-07-16 NOTE — Telephone Encounter (Signed)
Copied from [place]. Topic: Quick Communication - Rx Refill/Question >> Jul 16, 2017 10:16 AM Ricardo Wilson wrote: Has the patient contacted their pharmacy? yes  Pt is asking for refill on celecoxib (CELEBREX) 200 MG capsule [[id number]]  Contact to advise if needed

## 2017-09-24 DIAGNOSIS — D123 Benign neoplasm of transverse colon: Secondary | ICD-10-CM | POA: Diagnosis not present

## 2017-09-24 DIAGNOSIS — Z1211 Encounter for screening for malignant neoplasm of colon: Secondary | ICD-10-CM | POA: Diagnosis not present

## 2017-09-24 DIAGNOSIS — Z86010 Personal history of colonic polyps: Secondary | ICD-10-CM | POA: Diagnosis not present

## 2017-09-24 DIAGNOSIS — K648 Other hemorrhoids: Secondary | ICD-10-CM | POA: Diagnosis not present

## 2017-09-24 DIAGNOSIS — K640 First degree hemorrhoids: Secondary | ICD-10-CM | POA: Diagnosis not present

## 2017-09-24 DIAGNOSIS — D126 Benign neoplasm of colon, unspecified: Secondary | ICD-10-CM | POA: Diagnosis not present

## 2017-09-24 DIAGNOSIS — K635 Polyp of colon: Secondary | ICD-10-CM | POA: Diagnosis not present

## 2017-09-24 DIAGNOSIS — K5730 Diverticulosis of large intestine without perforation or abscess without bleeding: Secondary | ICD-10-CM | POA: Diagnosis not present

## 2017-09-24 LAB — HM COLONOSCOPY

## 2017-09-25 ENCOUNTER — Encounter: Payer: Self-pay | Admitting: Family Medicine

## 2017-09-27 ENCOUNTER — Encounter: Payer: Self-pay | Admitting: Family Medicine

## 2018-04-02 ENCOUNTER — Encounter: Payer: Medicare HMO | Admitting: Family Medicine

## 2018-04-08 ENCOUNTER — Ambulatory Visit (INDEPENDENT_AMBULATORY_CARE_PROVIDER_SITE_OTHER)
Admission: RE | Admit: 2018-04-08 | Discharge: 2018-04-08 | Disposition: A | Discharge status: Home / Self Care | Payer: Medicare HMO | Source: Ambulatory Visit | Attending: Family Medicine | Admitting: Family Medicine

## 2018-04-08 ENCOUNTER — Ambulatory Visit (INDEPENDENT_AMBULATORY_CARE_PROVIDER_SITE_OTHER): Payer: Medicare HMO | Admitting: Family Medicine

## 2018-04-08 ENCOUNTER — Encounter: Payer: Self-pay | Admitting: Family Medicine

## 2018-04-08 VITALS — BP 116/78 | HR 79 | Temp 98.4°F | Ht 67.0 in | Wt 180.5 lb

## 2018-04-08 DIAGNOSIS — Z23 Encounter for immunization: Secondary | ICD-10-CM | POA: Diagnosis not present

## 2018-04-08 DIAGNOSIS — R7989 Other specified abnormal findings of blood chemistry: Secondary | ICD-10-CM

## 2018-04-08 DIAGNOSIS — R739 Hyperglycemia, unspecified: Secondary | ICD-10-CM

## 2018-04-08 DIAGNOSIS — R200 Anesthesia of skin: Secondary | ICD-10-CM

## 2018-04-08 DIAGNOSIS — M79642 Pain in left hand: Secondary | ICD-10-CM

## 2018-04-08 DIAGNOSIS — R202 Paresthesia of skin: Secondary | ICD-10-CM | POA: Diagnosis not present

## 2018-04-08 DIAGNOSIS — Z0000 Encounter for general adult medical examination without abnormal findings: Secondary | ICD-10-CM

## 2018-04-08 DIAGNOSIS — Z7189 Other specified counseling: Secondary | ICD-10-CM

## 2018-04-08 DIAGNOSIS — J449 Chronic obstructive pulmonary disease, unspecified: Secondary | ICD-10-CM

## 2018-04-08 DIAGNOSIS — R0602 Shortness of breath: Secondary | ICD-10-CM | POA: Diagnosis not present

## 2018-04-08 DIAGNOSIS — M79641 Pain in right hand: Secondary | ICD-10-CM

## 2018-04-08 MED ORDER — ALBUTEROL SULFATE HFA 108 (90 BASE) MCG/ACT IN AERS: 1.0000 | INHALATION_SPRAY | Freq: Four times a day (QID) | RESPIRATORY_TRACT | 5 refills | Status: DC | PRN

## 2018-04-08 MED ORDER — RANITIDINE HCL 300 MG PO TABS: 300.0000 mg | ORAL_TABLET | Freq: Two times a day (BID) | ORAL | 3 refills | Status: DC

## 2018-04-08 MED ORDER — CELECOXIB 200 MG PO CAPS: 200.0000 mg | ORAL_CAPSULE | Freq: Every day | ORAL | 3 refills | Status: DC

## 2018-04-08 MED ORDER — TIOTROPIUM BROMIDE MONOHYDRATE 18 MCG IN CAPS: 18.0000 ug | ORAL_CAPSULE | Freq: Every day | RESPIRATORY_TRACT | 3 refills | Status: DC

## 2018-04-08 NOTE — Assessment & Plan Note (Signed)
Advance directive- either son equally designated if patient were incapactated.

## 2018-04-08 NOTE — Progress Notes (Signed)
I have personally reviewed the Medicare Annual Wellness questionnaire and have noted 1. The patient's medical and social history 2. Their use of alcohol, tobacco or illicit drugs 3. Their current medications and supplements 4. The patient's functional ability including ADL's, fall risks, home safety risks and hearing or visual             impairment. 5. Diet and physical activities 6. Evidence for depression or mood disorders  The patients weight, height, BMI have been recorded in the chart and visual acuity is per eye clinic.  I have made referrals, counseling and provided education to the patient based review of the above and I have provided the pt with a written personalized care plan for preventive services.  Provider list updated- see scanned forms.  Routine anticipatory guidance given to patient.  See health maintenance. The possibility exists that previously documented standard health maintenance information may have been brought forward from a previous encounter into this note.  If needed, that same information has been updated to reflect the current situation based on today's encounter.    Flu today Shingles d/w pt.  See AVS.   PNA UTD Tetanus UTD Colonoscopy 2019 Prostate cancer screening and PSA options (with potential risks and benefits of testing vs not testing) were discussed along with recent recs/guidelines.  He declined testing PSA at this point. Advance directive- either son equally designated if patient were incapactated.   Cognitive function addressed- see scanned forms- and if abnormal then additional documentation follows.   COPD. Inhaler use usually only in the fall though winter. No ADE on meds. Compliant in seasons when needed. Not SOB o/w unless with expected (high) level of exertion, ie hauling his full trash can up a slope, moving a mattress into a house recently.  He has possibly noted a gradual worsening over the years.  No acute changes.  He had only recently  restarted spiriva.   OA.  Taking celebrex daily but still with R>L hand pain. Can radiate up the R forearm.  Sig pain at night, waking up in pain.  He has B wrist tinel pos with numbness in B hands, esp with driving.    PMH and SH reviewed  Meds, vitals, and allergies reviewed.   ROS: Per HPI.  Unless specifically indicated otherwise in HPI, the patient denies:  General: fever. Eyes: acute vision changes ENT: sore throat Cardiovascular: chest pain Respiratory: SOB GI: vomiting GU: dysuria Musculoskeletal: acute back pain Derm: acute rash Neuro: acute motor dysfunction Psych: worsening mood Endocrine: polydipsia Heme: bleeding Allergy: hayfever  GEN: nad, alert and oriented HEENT: mucous membranes moist NECK: supple w/o LA CV: rrr. PULM: ctab, no inc wob ABD: soft, +bs EXT: no edema SKIN: no acute rash He has chronic bilateral IP joint changes noted on the hands.  Tinel positive at the wrist.  Grip still intact.  Normal radial pulse.  Exam is symmetric.  No rash.  No bruising.

## 2018-04-08 NOTE — Assessment & Plan Note (Signed)
See discussion of hand pain.

## 2018-04-08 NOTE — Assessment & Plan Note (Signed)
He has had some progressive shortness of breath over the years.  This is likely more recently related to lack of Spiriva use.  It probably makes sense to use Spiriva more consistently and use albuterol as needed.  Discussed with patient.  Check chest x-ray today.  He will update me if he is not improving with that med plan.  Lungs are clear on exam.  Okay for outpatient follow-up.

## 2018-04-08 NOTE — Assessment & Plan Note (Signed)
He likely has a multifactorial issue with osteoarthritis in the IP joints along with some likely carpal tunnel symptoms.  Discussed with him about using a nocturnal splint to see if it would make a difference.  Reasonable to check TSH and B12 today.  See notes on labs.  Update me as needed.  Continue Celebrex in the meantime.

## 2018-04-08 NOTE — Patient Instructions (Addendum)
Call about an eye exam when possible.   The shingles shot is out of stock here at the clinic.  See if your insurance will cover it and if the pharmacy has it.   Go to the lab on the way out.  We'll contact you with your lab report. Try a wrist brace a night and see if that helps.   Stick with the spiriva daily and use the albuterol if needed.  If that isn't helping, then let me know.   Take care.  Glad to see you.

## 2018-04-08 NOTE — Assessment & Plan Note (Signed)
Flu today Shingles d/w pt.  See AVS.   PNA UTD Tetanus UTD Colonoscopy 2019 Prostate cancer screening and PSA options (with potential risks and benefits of testing vs not testing) were discussed along with recent recs/guidelines.  He declined testing PSA at this point. Advance directive- either son equally designated if patient were incapactated.   Cognitive function addressed- see scanned forms- and if abnormal then additional documentation follows.

## 2018-04-09 LAB — COMPREHENSIVE METABOLIC PANEL
ALT: 22 U/L (ref 0–53)
AST: 21 U/L (ref 0–37)
Albumin: 4.3 g/dL (ref 3.5–5.2)
Alkaline Phosphatase: 58 U/L (ref 39–117)
BUN: 22 mg/dL (ref 6–23)
CO2: 29 mEq/L (ref 19–32)
Calcium: 9.7 mg/dL (ref 8.4–10.5)
Chloride: 104 mEq/L (ref 96–112)
Creatinine, Ser: 1.16 mg/dL (ref 0.40–1.50)
GFR: 65.01 mL/min (ref >60.00)
Glucose, Bld: 89 mg/dL (ref 70–99)
Potassium: 4.5 mEq/L (ref 3.5–5.1)
Sodium: 139 mEq/L (ref 135–145)
Total Bilirubin: 0.7 mg/dL (ref 0.2–1.2)
Total Protein: 7.1 g/dL (ref 6.0–8.3)

## 2018-04-09 LAB — LIPID PANEL
Cholesterol: 144 mg/dL (ref 0–200)
HDL: 34.4 mg/dL — ABNORMAL LOW (ref >39.00)
LDL Cholesterol: 91 mg/dL (ref 0–99)
NonHDL: 109.73
Total CHOL/HDL Ratio: 4
Triglycerides: 96 mg/dL (ref 0.0–149.0)
VLDL: 19.2 mg/dL (ref 0.0–40.0)

## 2018-04-09 LAB — VITAMIN B12: Vitamin B-12: 85 pg/mL — ABNORMAL LOW (ref 211–911)

## 2018-04-09 LAB — TSH: TSH: 4.56 u[IU]/mL — ABNORMAL HIGH (ref 0.35–4.50)

## 2018-04-10 ENCOUNTER — Other Ambulatory Visit: Payer: Self-pay | Admitting: Family Medicine

## 2018-04-10 DIAGNOSIS — R7989 Other specified abnormal findings of blood chemistry: Secondary | ICD-10-CM

## 2018-04-10 DIAGNOSIS — E538 Deficiency of other specified B group vitamins: Secondary | ICD-10-CM | POA: Insufficient documentation

## 2018-04-10 MED ORDER — CYANOCOBALAMIN 1000 MCG/ML IJ SOLN: INTRAMUSCULAR | Status: DC

## 2018-04-11 ENCOUNTER — Encounter: Payer: Self-pay | Admitting: *Deleted

## 2018-04-12 ENCOUNTER — Ambulatory Visit (INDEPENDENT_AMBULATORY_CARE_PROVIDER_SITE_OTHER): Payer: Medicare HMO | Admitting: Family Medicine

## 2018-04-12 ENCOUNTER — Encounter: Payer: Self-pay | Admitting: Family Medicine

## 2018-04-12 VITALS — BP 144/76 | HR 87 | Temp 98.4°F | Ht 67.0 in | Wt 178.5 lb

## 2018-04-12 DIAGNOSIS — E538 Deficiency of other specified B group vitamins: Secondary | ICD-10-CM

## 2018-04-12 MED ORDER — CYANOCOBALAMIN 1000 MCG/ML IJ SOLN
1000.0000 ug | Freq: Once | INTRAMUSCULAR | Status: AC
Start: 2018-04-12 — End: 2018-04-12
  Administered 2018-04-12: 1000 ug via INTRAMUSCULAR

## 2018-04-12 NOTE — Progress Notes (Signed)
He had noted fatigue, upon reflection on his situation.  And he had noted that he was a little "more clumsy."  Sx noted in the last few months.  D/w pt.    B12 low on recent testing.    TSH minimally abnormal.  Discussed with patient about follow-up testing later on.  No need for treatment or testing now.  Meds, vitals, and allergies reviewed.   ROS: Per HPI unless specifically indicated in ROS section   nad ncat Mmm Neck supple, no LA rrr  ctab Slight trouble with tandem gait, but he does not fall. Normal romberg Strength and sensation grossly normal otherwise x4. Alert and oriented.

## 2018-04-12 NOTE — Patient Instructions (Signed)
B12 shot today.  Think about your preference for repeat injection, either here or self administration at home.  If you want to get the shot done at home, then let us know and we'll set you up for teaching here.  If that is going to be the plan, then I'll send the rx for the B12 and syringes to the pharmacy.   Either way, you'll need a repeat shot weekly for doses, then monthly.  You'll need recheck labs in about 3-4 months.  Take care.  Glad to see you.

## 2018-04-14 NOTE — Assessment & Plan Note (Signed)
B12 injection done today.  Repeat in 1 week.  He will consider options about home injection or follow-up here after that.  If he is going to do home injection he knows he will need B12 injection teaching here in the clinic first.  Plan on second dose here next week.  Rationale for treatment discussed with patient.  Recent labs discussed with patient.  See after visit summary.

## 2018-04-18 ENCOUNTER — Ambulatory Visit (INDEPENDENT_AMBULATORY_CARE_PROVIDER_SITE_OTHER): Payer: Medicare HMO | Admitting: *Deleted

## 2018-04-18 ENCOUNTER — Ambulatory Visit: Payer: Medicare HMO

## 2018-04-18 DIAGNOSIS — E538 Deficiency of other specified B group vitamins: Secondary | ICD-10-CM | POA: Diagnosis not present

## 2018-04-18 MED ORDER — CYANOCOBALAMIN 1000 MCG/ML IJ SOLN
1000.0000 ug | Freq: Once | INTRAMUSCULAR | Status: AC
  Administered 2018-04-18: 1000 ug via INTRAMUSCULAR

## 2018-04-18 NOTE — Progress Notes (Signed)
Per orders of Dr. Duncan, injection of B12 given by [place], Shapale M. Patient tolerated injection well. 

## 2018-04-20 ENCOUNTER — Other Ambulatory Visit: Payer: Self-pay | Admitting: Family Medicine

## 2018-04-25 ENCOUNTER — Ambulatory Visit (INDEPENDENT_AMBULATORY_CARE_PROVIDER_SITE_OTHER): Payer: Medicare HMO

## 2018-04-25 DIAGNOSIS — E538 Deficiency of other specified B group vitamins: Secondary | ICD-10-CM

## 2018-04-25 MED ORDER — CYANOCOBALAMIN 1000 MCG/ML IJ SOLN
1000.0000 ug | Freq: Once | INTRAMUSCULAR | Status: AC
  Administered 2018-04-25: 1000 ug via INTRAMUSCULAR

## 2018-04-25 NOTE — Progress Notes (Signed)
Per orders of Dr. [name], injection of Vitamin B12 given by Diamond Nickel with patient education and instruction. Patient tolerated injection well.   Patient education for self administration technique for Vitamin B12 injection.  After education and practice on test stick pad, patient was able to safely administer 74ml IM to left thigh muscle.  He will self administer 1 more weekly injection here at the office under my supervision confirming his comfort level before med is called in to pharmacy and he moves forward with home injections.

## 2018-05-02 ENCOUNTER — Ambulatory Visit (INDEPENDENT_AMBULATORY_CARE_PROVIDER_SITE_OTHER): Payer: Medicare HMO

## 2018-05-02 DIAGNOSIS — E538 Deficiency of other specified B group vitamins: Secondary | ICD-10-CM

## 2018-05-02 MED ORDER — CYANOCOBALAMIN 1000 MCG/ML IJ SOLN
1000.0000 ug | Freq: Once | INTRAMUSCULAR | Status: AC
  Administered 2018-05-02: 1000 ug via INTRAMUSCULAR

## 2018-05-02 NOTE — Progress Notes (Signed)
Per orders of Dr. [name], injection of Vitamin b12 given by Diamond Nickel. Patient tolerated injection well.   Patient in for last weekly dose of B12 before switching to monthly injections.  He continues with self administration training.  He is not ready to give up but is having difficulty with the instructions.  He was given some devices to practice only with at home on an orange with sterile water.  In addition, he was given written instructions to follow.  He will continue to come to clinic for injections for the time being until I am comfortable with his level of safety.  At this time, he is not ready to undertake home administration.  Appointment has been set up to continue training with next injection in 1 month on 11/26.  Patient verbalizes agreement with plan.

## 2018-06-04 ENCOUNTER — Ambulatory Visit (INDEPENDENT_AMBULATORY_CARE_PROVIDER_SITE_OTHER): Payer: Medicare HMO

## 2018-06-04 DIAGNOSIS — E538 Deficiency of other specified B group vitamins: Secondary | ICD-10-CM

## 2018-06-04 MED ORDER — CYANOCOBALAMIN 1000 MCG/ML IJ SOLN
1000.0000 ug | Freq: Once | INTRAMUSCULAR | Status: AC
Start: 2018-06-04 — End: 2018-06-04
  Administered 2018-06-04: 1000 ug via INTRAMUSCULAR

## 2018-06-04 NOTE — Progress Notes (Signed)
Per orders of Dr.Duncan, injection of Vitamin B12 given by Diamond Nickel. Patient tolerated injection well.  Patient admitted that even after practicing at home, he was not comfortable moving forward with home self injections.  He will come monthly to clinic for clinic staff to administer.

## 2018-07-12 ENCOUNTER — Telehealth: Payer: Self-pay

## 2018-07-12 NOTE — Telephone Encounter (Signed)
Advised patient that I did not call him.  He has a B12 injection scheduled on 07/16/18 and it was probably the automated reminder call.

## 2018-07-12 NOTE — Telephone Encounter (Signed)
Copied from [place] [phone]. Topic: General - Other >> Jul 11, 2018  5:42 PM Yvette Rack wrote: Reason for CRM: Pt returned all to office. Pt requests call back. Cb# [phone]

## 2018-07-16 ENCOUNTER — Ambulatory Visit (INDEPENDENT_AMBULATORY_CARE_PROVIDER_SITE_OTHER): Payer: Medicare HMO | Admitting: *Deleted

## 2018-07-16 DIAGNOSIS — E538 Deficiency of other specified B group vitamins: Secondary | ICD-10-CM

## 2018-07-16 MED ORDER — CYANOCOBALAMIN 1000 MCG/ML IJ SOLN
1000.0000 ug | Freq: Once | INTRAMUSCULAR | Status: AC
  Administered 2018-07-16: 1000 ug via INTRAMUSCULAR

## 2018-07-16 NOTE — Progress Notes (Signed)
Per orders of Dr. Duncan, injection of b12 given by Korin Hartwell H. Patient tolerated injection well.  

## 2018-08-20 ENCOUNTER — Ambulatory Visit (INDEPENDENT_AMBULATORY_CARE_PROVIDER_SITE_OTHER): Payer: Medicare HMO

## 2018-08-20 ENCOUNTER — Ambulatory Visit: Payer: Medicare HMO

## 2018-08-20 DIAGNOSIS — E538 Deficiency of other specified B group vitamins: Secondary | ICD-10-CM

## 2018-08-20 MED ORDER — CYANOCOBALAMIN 1000 MCG/ML IJ SOLN
1000.0000 ug | Freq: Once | INTRAMUSCULAR | Status: AC
  Administered 2018-08-20: 1000 ug via INTRAMUSCULAR

## 2018-08-20 NOTE — Progress Notes (Signed)
Per orders of Dr. Duncan, injection of vit B12 given by Moustapha Tooker. Patient tolerated injection well.  

## 2018-09-18 DIAGNOSIS — H524 Presbyopia: Secondary | ICD-10-CM | POA: Diagnosis not present

## 2018-09-18 DIAGNOSIS — Z0100 Encounter for examination of eyes and vision without abnormal findings: Secondary | ICD-10-CM | POA: Diagnosis not present

## 2018-09-19 ENCOUNTER — Other Ambulatory Visit: Payer: Self-pay

## 2018-09-19 ENCOUNTER — Ambulatory Visit (INDEPENDENT_AMBULATORY_CARE_PROVIDER_SITE_OTHER): Payer: Medicare HMO | Admitting: *Deleted

## 2018-09-19 DIAGNOSIS — E538 Deficiency of other specified B group vitamins: Secondary | ICD-10-CM | POA: Diagnosis not present

## 2018-09-19 MED ORDER — CYANOCOBALAMIN 1000 MCG/ML IJ SOLN
1000.0000 ug | Freq: Once | INTRAMUSCULAR | Status: AC
Start: 2018-09-19 — End: 2018-09-19
  Administered 2018-09-19: 1000 ug via INTRAMUSCULAR

## 2018-09-19 MED ORDER — CYANOCOBALAMIN 1000 MCG/ML IJ SOLN: 1000.0000 ug | Freq: Once | INTRAMUSCULAR | Status: DC

## 2018-09-19 NOTE — Progress Notes (Signed)
Per orders of Dr. Duncan, injection of B12 given by Veronda Gabor M. Patient tolerated injection well. 

## 2018-10-24 ENCOUNTER — Ambulatory Visit (INDEPENDENT_AMBULATORY_CARE_PROVIDER_SITE_OTHER): Payer: Medicare HMO | Admitting: *Deleted

## 2018-10-24 DIAGNOSIS — E538 Deficiency of other specified B group vitamins: Secondary | ICD-10-CM | POA: Diagnosis not present

## 2018-10-24 MED ORDER — CYANOCOBALAMIN 1000 MCG/ML IJ SOLN
1000.0000 ug | Freq: Once | INTRAMUSCULAR | Status: AC
  Administered 2018-10-24: 15:00:00 1000 ug via INTRAMUSCULAR

## 2018-10-24 NOTE — Progress Notes (Signed)
Per orders of Dr. Duncan, injection of B12 given by [place], Shapale M. Patient tolerated injection well. 

## 2019-01-22 DIAGNOSIS — M25532 Pain in left wrist: Secondary | ICD-10-CM | POA: Diagnosis not present

## 2019-01-22 DIAGNOSIS — M25531 Pain in right wrist: Secondary | ICD-10-CM | POA: Diagnosis not present

## 2019-01-27 ENCOUNTER — Telehealth: Payer: Self-pay | Admitting: Family Medicine

## 2019-01-27 ENCOUNTER — Other Ambulatory Visit: Payer: Self-pay | Admitting: *Deleted

## 2019-01-27 NOTE — Telephone Encounter (Signed)
Patient's wife called in saying that the pharmacy instructed them to call this office for refills.  I suspect it is because the Ranitidine has been taken off the market and will require another medication.   Electronic refill request. Celebrex Last office visit:   04/12/2018 Last Filled:    90 capsule 3 04/08/2018  Please advise.

## 2019-01-27 NOTE — Telephone Encounter (Signed)
Patient requested a refill   Patient stated the pharmacy advised her to call our office    Ranitidine 300 mg tablet  Celecoxib 200 Mg  [facility]

## 2019-01-28 MED ORDER — PANTOPRAZOLE SODIUM 20 MG PO TBEC: 20.0000 mg | DELAYED_RELEASE_TABLET | Freq: Every day | ORAL | 1 refills | Status: DC

## 2019-01-28 MED ORDER — CELECOXIB 200 MG PO CAPS: 200.0000 mg | ORAL_CAPSULE | Freq: Every day | ORAL | 1 refills | Status: DC

## 2019-01-28 NOTE — Telephone Encounter (Signed)
Will change to Protonix given shortage on famotidine.  Prescription sent.

## 2019-02-06 DIAGNOSIS — G5603 Carpal tunnel syndrome, bilateral upper limbs: Secondary | ICD-10-CM | POA: Diagnosis not present

## 2019-02-06 DIAGNOSIS — G5623 Lesion of ulnar nerve, bilateral upper limbs: Secondary | ICD-10-CM | POA: Diagnosis not present

## 2019-02-06 DIAGNOSIS — M5412 Radiculopathy, cervical region: Secondary | ICD-10-CM | POA: Diagnosis not present

## 2019-02-19 DIAGNOSIS — G5603 Carpal tunnel syndrome, bilateral upper limbs: Secondary | ICD-10-CM | POA: Diagnosis not present

## 2019-02-19 DIAGNOSIS — M25531 Pain in right wrist: Secondary | ICD-10-CM | POA: Diagnosis not present

## 2019-02-19 DIAGNOSIS — M25532 Pain in left wrist: Secondary | ICD-10-CM | POA: Diagnosis not present

## 2019-02-19 DIAGNOSIS — G5602 Carpal tunnel syndrome, left upper limb: Secondary | ICD-10-CM | POA: Diagnosis not present

## 2019-02-19 DIAGNOSIS — G5601 Carpal tunnel syndrome, right upper limb: Secondary | ICD-10-CM | POA: Diagnosis not present

## 2019-03-06 DIAGNOSIS — Z01812 Encounter for preprocedural laboratory examination: Secondary | ICD-10-CM | POA: Diagnosis not present

## 2019-03-06 DIAGNOSIS — Z20828 Contact with and (suspected) exposure to other viral communicable diseases: Secondary | ICD-10-CM | POA: Diagnosis not present

## 2019-03-10 DIAGNOSIS — G5621 Lesion of ulnar nerve, right upper limb: Secondary | ICD-10-CM | POA: Diagnosis not present

## 2019-03-10 DIAGNOSIS — J449 Chronic obstructive pulmonary disease, unspecified: Secondary | ICD-10-CM | POA: Diagnosis not present

## 2019-03-10 DIAGNOSIS — M1990 Unspecified osteoarthritis, unspecified site: Secondary | ICD-10-CM | POA: Diagnosis not present

## 2019-03-10 DIAGNOSIS — K219 Gastro-esophageal reflux disease without esophagitis: Secondary | ICD-10-CM | POA: Diagnosis not present

## 2019-03-10 DIAGNOSIS — G5601 Carpal tunnel syndrome, right upper limb: Secondary | ICD-10-CM | POA: Diagnosis not present

## 2019-04-10 ENCOUNTER — Other Ambulatory Visit: Payer: Self-pay | Admitting: Family Medicine

## 2019-04-10 NOTE — Telephone Encounter (Signed)
Electronic refill request. Celebrex Last office visit:   04/12/2018  No upcoming appts scheduled Last Filled:    90 capsule 1 01/28/2019  Please advise.

## 2019-04-11 NOTE — Telephone Encounter (Signed)
Sent. Thanks.  Please schedule AMW when possible.  

## 2019-04-16 ENCOUNTER — Other Ambulatory Visit: Payer: Self-pay | Admitting: Family Medicine

## 2019-04-16 NOTE — Telephone Encounter (Signed)
Appointment made for CPE in November, first available.

## 2019-05-26 ENCOUNTER — Other Ambulatory Visit: Payer: Self-pay

## 2019-05-26 ENCOUNTER — Other Ambulatory Visit (INDEPENDENT_AMBULATORY_CARE_PROVIDER_SITE_OTHER): Payer: Medicare HMO

## 2019-05-26 DIAGNOSIS — R7989 Other specified abnormal findings of blood chemistry: Secondary | ICD-10-CM | POA: Diagnosis not present

## 2019-05-26 DIAGNOSIS — E538 Deficiency of other specified B group vitamins: Secondary | ICD-10-CM

## 2019-05-26 LAB — VITAMIN B12: Vitamin B-12: 150 pg/mL — ABNORMAL LOW (ref 211–911)

## 2019-05-26 LAB — TSH: TSH: 4.27 u[IU]/mL (ref 0.35–4.50)

## 2019-05-30 ENCOUNTER — Other Ambulatory Visit: Payer: Self-pay

## 2019-05-30 ENCOUNTER — Encounter: Payer: Self-pay | Admitting: Family Medicine

## 2019-05-30 ENCOUNTER — Ambulatory Visit (INDEPENDENT_AMBULATORY_CARE_PROVIDER_SITE_OTHER): Payer: Medicare HMO | Admitting: Family Medicine

## 2019-05-30 VITALS — BP 142/70 | HR 77 | Temp 97.7°F | Ht 67.0 in | Wt 183.2 lb

## 2019-05-30 DIAGNOSIS — R200 Anesthesia of skin: Secondary | ICD-10-CM

## 2019-05-30 DIAGNOSIS — E538 Deficiency of other specified B group vitamins: Secondary | ICD-10-CM | POA: Diagnosis not present

## 2019-05-30 DIAGNOSIS — Z23 Encounter for immunization: Secondary | ICD-10-CM

## 2019-05-30 DIAGNOSIS — Z7189 Other specified counseling: Secondary | ICD-10-CM

## 2019-05-30 DIAGNOSIS — J449 Chronic obstructive pulmonary disease, unspecified: Secondary | ICD-10-CM | POA: Diagnosis not present

## 2019-05-30 DIAGNOSIS — K219 Gastro-esophageal reflux disease without esophagitis: Secondary | ICD-10-CM

## 2019-05-30 DIAGNOSIS — Z0000 Encounter for general adult medical examination without abnormal findings: Secondary | ICD-10-CM | POA: Diagnosis not present

## 2019-05-30 DIAGNOSIS — M1990 Unspecified osteoarthritis, unspecified site: Secondary | ICD-10-CM

## 2019-05-30 DIAGNOSIS — R7989 Other specified abnormal findings of blood chemistry: Secondary | ICD-10-CM

## 2019-05-30 MED ORDER — ALBUTEROL SULFATE HFA 108 (90 BASE) MCG/ACT IN AERS: 1.0000 | INHALATION_SPRAY | Freq: Four times a day (QID) | RESPIRATORY_TRACT | 2 refills | Status: DC | PRN

## 2019-05-30 MED ORDER — CELECOXIB 200 MG PO CAPS: ORAL_CAPSULE | ORAL | 12 refills | Status: DC

## 2019-05-30 MED ORDER — CYANOCOBALAMIN 1000 MCG/ML IJ SOLN
1000.0000 ug | Freq: Once | INTRAMUSCULAR | Status: AC
  Administered 2019-05-30: 13:00:00 1000 ug via INTRAMUSCULAR

## 2019-05-30 MED ORDER — SPIRIVA HANDIHALER 18 MCG IN CAPS: ORAL_CAPSULE | RESPIRATORY_TRACT | 12 refills | Status: DC

## 2019-05-30 MED ORDER — CYANOCOBALAMIN 1000 MCG/ML IJ SOLN: INTRAMUSCULAR | Status: AC

## 2019-05-30 MED ORDER — PANTOPRAZOLE SODIUM 20 MG PO TBEC: 20.0000 mg | DELAYED_RELEASE_TABLET | Freq: Every day | ORAL | 12 refills | Status: DC

## 2019-05-30 NOTE — Patient Instructions (Addendum)
B12 and flu shot today.    B12 shot monthly.  Please schedule on the way out.   Take care.  Glad to see you.  I sent your refills to the pharmacy.

## 2019-05-30 NOTE — Progress Notes (Signed)
This visit occurred during the SARS-CoV-2 public health emergency.  Safety protocols were in place, including screening questions prior to the visit, additional usage of staff PPE, and extensive cleaning of exam room while observing appropriate contact time as indicated for disinfecting solutions.    I have personally reviewed the Medicare Annual Wellness questionnaire and have noted 1. The patient's medical and social history 2. Their use of alcohol, tobacco or illicit drugs 3. Their current medications and supplements 4. The patient's functional ability including ADL's, fall risks, home safety risks and hearing or visual             impairment. 5. Diet and physical activities 6. Evidence for depression or mood disorders  The patients weight, height, BMI have been recorded in the chart and visual acuity is per eye clinic.  I have made referrals, counseling and provided education to the patient based review of the above and I have provided the pt with a written personalized care plan for preventive services.  Provider list updated- see scanned forms.  Routine anticipatory guidance given to patient.  See health maintenance. The possibility exists that previously documented standard health maintenance information may have been brought forward from a previous encounter into this note.  If needed, that same information has been updated to reflect the current situation based on today's encounter.    Flu today Shingles d/w pt. PNA UTD Tetanus UTD Colonoscopy 2019 Prostate cancer screening and PSA options (with potential risks and benefits of testing vs not testing) were discussed along with recent recs/guidelines.  He declined testing PSA at this point. Advance directive-wife theneither son equallydesignated if patient were incapactated.   Cognitive function addressed- see scanned forms- and if abnormal then additional documentation follows.  COPD.  Compliant with spiriva with prn/rare SABA.   No sputum.  No wheeze.    Still with low B12.  D/w pt about restart.  Injection done today.  See after visit summary.  TSH wnl now. D/w pt.   Still on celebrex at baseline.  No ADE on med.   Still on PPI.  No GERD sx.  No ADE on med.    Had R carpal tunnel syndrome surgery.  His pain is better but not resolved.  He is improved from previous.  PMH and SH reviewed  Meds, vitals, and allergies reviewed.   ROS: Per HPI.  Unless specifically indicated otherwise in HPI, the patient denies:  General: fever. Eyes: acute vision changes ENT: sore throat Cardiovascular: chest pain Respiratory: SOB GI: vomiting GU: dysuria Musculoskeletal: acute back pain Derm: acute rash Neuro: acute motor dysfunction Psych: worsening mood Endocrine: polydipsia Heme: bleeding Allergy: hayfever  GEN: nad, alert and oriented HEENT: ncat NECK: supple w/o LA CV: rrr. PULM: ctab, no inc wob ABD: soft, +bs EXT: no edema SKIN: no acute rash  Health Maintenance  Topic Date Due  . COLONOSCOPY  09/25/2022  . TETANUS/TDAP  06/05/2024  . INFLUENZA VACCINE  Completed  . PNA vac Low Risk Adult  Completed

## 2019-06-02 DIAGNOSIS — K219 Gastro-esophageal reflux disease without esophagitis: Secondary | ICD-10-CM | POA: Insufficient documentation

## 2019-06-02 DIAGNOSIS — M1990 Unspecified osteoarthritis, unspecified site: Secondary | ICD-10-CM | POA: Insufficient documentation

## 2019-06-02 NOTE — Assessment & Plan Note (Signed)
Still with low B12.  D/w pt about restart.  Injection done today.  See after visit summary.

## 2019-06-02 NOTE — Assessment & Plan Note (Signed)
Flu today Shingles d/w pt. PNA UTD Tetanus UTD Colonoscopy 2019 Prostate cancer screening and PSA options (with potential risks and benefits of testing vs not testing) were discussed along with recent recs/guidelines.  He declined testing PSA at this point. Advance directive-wife theneither son equallydesignated if patient were incapactated.   Cognitive function addressed- see scanned forms- and if abnormal then additional documentation follows.

## 2019-06-02 NOTE — Assessment & Plan Note (Signed)
TSH wnl now. D/w pt.

## 2019-06-02 NOTE — Assessment & Plan Note (Signed)
Advance directive-wife theneither son equallydesignated if patient were incapactated.   

## 2019-06-02 NOTE — Assessment & Plan Note (Signed)
Compliant with spiriva with prn/rare SABA.  No sputum.  No wheeze.   Continue as is.  He agrees.

## 2019-06-02 NOTE — Assessment & Plan Note (Signed)
Still on PPI.  No GERD sx.  No ADE on med.

## 2019-06-02 NOTE — Assessment & Plan Note (Signed)
Had R carpal tunnel syndrome surgery.  His pain is better but not resolved.  He is improved from previous.  I hope he will continue to improve.

## 2019-06-02 NOTE — Assessment & Plan Note (Signed)
Still on celebrex at baseline.  No ADE on med.  Continue as is.

## 2019-07-09 ENCOUNTER — Ambulatory Visit (INDEPENDENT_AMBULATORY_CARE_PROVIDER_SITE_OTHER): Payer: Medicare HMO | Admitting: *Deleted

## 2019-07-09 ENCOUNTER — Other Ambulatory Visit: Payer: Self-pay

## 2019-07-09 DIAGNOSIS — E538 Deficiency of other specified B group vitamins: Secondary | ICD-10-CM | POA: Diagnosis not present

## 2019-07-09 MED ORDER — CYANOCOBALAMIN 1000 MCG/ML IJ SOLN
1000.0000 ug | Freq: Once | INTRAMUSCULAR | Status: AC
  Administered 2019-07-09: 1000 ug via INTRAMUSCULAR

## 2019-07-09 NOTE — Progress Notes (Signed)
Per orders of Dr. Philip Aspen (for Dr Elsie Stain), injection of B12 1000 mcg/mL given by Ashlee Bewley. Patient tolerated injection well.

## 2019-08-07 ENCOUNTER — Telehealth: Payer: Self-pay | Admitting: Family Medicine

## 2019-08-07 NOTE — Telephone Encounter (Signed)
Pt called and left a voicemail requesting a call back.   Pt had a question about his upcoming vaccines.injections scheduled.   Pt is scheduled for a B12 injection on 08/13/2019 Pt is also scheduled for his 2nd Covid vaccine on 08/19/2019  Is there any issue with getting these two injections in this close of a time frame? Should he reschedule the B12?  Please advise, thanks.

## 2019-08-07 NOTE — Telephone Encounter (Signed)
Okay to do both as is.  Neither should affect the other.  Thanks.

## 2019-08-07 NOTE — Telephone Encounter (Signed)
Pt aware of recommendations per Dr [name]. Nothing further needed.

## 2019-08-13 ENCOUNTER — Other Ambulatory Visit: Payer: Self-pay

## 2019-08-13 ENCOUNTER — Ambulatory Visit (INDEPENDENT_AMBULATORY_CARE_PROVIDER_SITE_OTHER): Payer: Medicare HMO | Admitting: *Deleted

## 2019-08-13 DIAGNOSIS — E538 Deficiency of other specified B group vitamins: Secondary | ICD-10-CM | POA: Diagnosis not present

## 2019-08-13 MED ORDER — CYANOCOBALAMIN 1000 MCG/ML IJ SOLN
1000.0000 ug | Freq: Once | INTRAMUSCULAR | Status: AC
  Administered 2019-08-13: 1000 ug via INTRAMUSCULAR

## 2019-08-13 NOTE — Progress Notes (Signed)
Per orders of Alma Friendly in the absence of Dr. [name], injection of Vit B12 given by Virl Cagey. Patient tolerated injection well.   Patient scheduled for next B12 appt on September 10, 2019 at 845am

## 2019-09-11 ENCOUNTER — Other Ambulatory Visit: Payer: Self-pay

## 2019-09-11 ENCOUNTER — Ambulatory Visit (INDEPENDENT_AMBULATORY_CARE_PROVIDER_SITE_OTHER): Payer: Medicare HMO

## 2019-09-11 DIAGNOSIS — E538 Deficiency of other specified B group vitamins: Secondary | ICD-10-CM

## 2019-09-11 MED ORDER — CYANOCOBALAMIN 1000 MCG/ML IJ SOLN
1000.0000 ug | Freq: Once | INTRAMUSCULAR | Status: AC
  Administered 2019-09-11: 1000 ug via INTRAMUSCULAR

## 2019-09-11 NOTE — Progress Notes (Addendum)
Per orders of Dr. [name], injection of B12 given by Pilar Grammes, CMA in Left Deltoid. Patient tolerated injection well.   ================ Please see if patient can come in for lab visit/RN visit combined next time.  Would get both done on the same day with the repeat B12 level prior to next injection.  B12 order is in.  Thanks.   Ricardo Wilson

## 2019-09-17 DIAGNOSIS — G5623 Lesion of ulnar nerve, bilateral upper limbs: Secondary | ICD-10-CM | POA: Diagnosis not present

## 2019-09-17 DIAGNOSIS — M25531 Pain in right wrist: Secondary | ICD-10-CM | POA: Diagnosis not present

## 2019-09-17 DIAGNOSIS — G5602 Carpal tunnel syndrome, left upper limb: Secondary | ICD-10-CM | POA: Diagnosis not present

## 2019-10-06 DIAGNOSIS — J449 Chronic obstructive pulmonary disease, unspecified: Secondary | ICD-10-CM | POA: Diagnosis not present

## 2019-10-06 DIAGNOSIS — G5622 Lesion of ulnar nerve, left upper limb: Secondary | ICD-10-CM | POA: Diagnosis not present

## 2019-10-06 DIAGNOSIS — Z885 Allergy status to narcotic agent status: Secondary | ICD-10-CM | POA: Diagnosis not present

## 2019-10-06 DIAGNOSIS — Z888 Allergy status to other drugs, medicaments and biological substances status: Secondary | ICD-10-CM | POA: Diagnosis not present

## 2019-10-06 DIAGNOSIS — Z87891 Personal history of nicotine dependence: Secondary | ICD-10-CM | POA: Diagnosis not present

## 2019-10-06 DIAGNOSIS — Z79899 Other long term (current) drug therapy: Secondary | ICD-10-CM | POA: Diagnosis not present

## 2019-10-06 DIAGNOSIS — M1991 Primary osteoarthritis, unspecified site: Secondary | ICD-10-CM | POA: Diagnosis not present

## 2019-10-06 DIAGNOSIS — G5602 Carpal tunnel syndrome, left upper limb: Secondary | ICD-10-CM | POA: Diagnosis not present

## 2019-10-06 DIAGNOSIS — K219 Gastro-esophageal reflux disease without esophagitis: Secondary | ICD-10-CM | POA: Diagnosis not present

## 2019-10-14 ENCOUNTER — Ambulatory Visit (INDEPENDENT_AMBULATORY_CARE_PROVIDER_SITE_OTHER): Payer: Medicare HMO

## 2019-10-14 ENCOUNTER — Other Ambulatory Visit (INDEPENDENT_AMBULATORY_CARE_PROVIDER_SITE_OTHER): Payer: Medicare HMO

## 2019-10-14 ENCOUNTER — Other Ambulatory Visit: Payer: Self-pay

## 2019-10-14 DIAGNOSIS — E538 Deficiency of other specified B group vitamins: Secondary | ICD-10-CM

## 2019-10-14 LAB — VITAMIN B12: Vitamin B-12: 220 pg/mL (ref 211–911)

## 2019-10-14 MED ORDER — CYANOCOBALAMIN 1000 MCG/ML IJ SOLN
1000.0000 ug | Freq: Once | INTRAMUSCULAR | Status: AC
  Administered 2019-10-14: 1000 ug via INTRAMUSCULAR

## 2019-10-14 NOTE — Progress Notes (Signed)
Per orders of Dr. Duncan, injection of B12 given by Eugene Isadore. Patient tolerated injection well.  

## 2019-11-05 DIAGNOSIS — H40033 Anatomical narrow angle, bilateral: Secondary | ICD-10-CM | POA: Diagnosis not present

## 2019-11-05 DIAGNOSIS — H25813 Combined forms of age-related cataract, bilateral: Secondary | ICD-10-CM | POA: Diagnosis not present

## 2019-11-05 DIAGNOSIS — H3500 Unspecified background retinopathy: Secondary | ICD-10-CM | POA: Diagnosis not present

## 2019-11-12 DIAGNOSIS — H40033 Anatomical narrow angle, bilateral: Secondary | ICD-10-CM | POA: Diagnosis not present

## 2019-11-12 DIAGNOSIS — H40039 Anatomical narrow angle, unspecified eye: Secondary | ICD-10-CM | POA: Diagnosis not present

## 2019-11-17 DIAGNOSIS — D2272 Melanocytic nevi of left lower limb, including hip: Secondary | ICD-10-CM | POA: Diagnosis not present

## 2019-11-17 DIAGNOSIS — L570 Actinic keratosis: Secondary | ICD-10-CM | POA: Diagnosis not present

## 2019-11-17 DIAGNOSIS — C44329 Squamous cell carcinoma of skin of other parts of face: Secondary | ICD-10-CM | POA: Diagnosis not present

## 2019-11-17 DIAGNOSIS — D2261 Melanocytic nevi of right upper limb, including shoulder: Secondary | ICD-10-CM | POA: Diagnosis not present

## 2019-11-17 DIAGNOSIS — D485 Neoplasm of uncertain behavior of skin: Secondary | ICD-10-CM | POA: Diagnosis not present

## 2019-11-17 DIAGNOSIS — D2271 Melanocytic nevi of right lower limb, including hip: Secondary | ICD-10-CM | POA: Diagnosis not present

## 2019-11-17 DIAGNOSIS — D225 Melanocytic nevi of trunk: Secondary | ICD-10-CM | POA: Diagnosis not present

## 2019-11-17 DIAGNOSIS — X32XXXA Exposure to sunlight, initial encounter: Secondary | ICD-10-CM | POA: Diagnosis not present

## 2019-11-17 DIAGNOSIS — D2262 Melanocytic nevi of left upper limb, including shoulder: Secondary | ICD-10-CM | POA: Diagnosis not present

## 2019-11-17 DIAGNOSIS — L821 Other seborrheic keratosis: Secondary | ICD-10-CM | POA: Diagnosis not present

## 2019-11-18 ENCOUNTER — Other Ambulatory Visit: Payer: Self-pay

## 2019-11-18 ENCOUNTER — Ambulatory Visit (INDEPENDENT_AMBULATORY_CARE_PROVIDER_SITE_OTHER): Payer: Medicare HMO

## 2019-11-18 DIAGNOSIS — E538 Deficiency of other specified B group vitamins: Secondary | ICD-10-CM

## 2019-11-18 MED ORDER — CYANOCOBALAMIN 1000 MCG/ML IJ SOLN
1000.0000 ug | Freq: Once | INTRAMUSCULAR | Status: AC
  Administered 2019-11-18: 1000 ug via INTRAMUSCULAR

## 2019-11-18 NOTE — Progress Notes (Signed)
Per orders of Dr. Duncan, injection of B12 given by Rankin Coolman. Patient tolerated injection well.  

## 2019-12-18 ENCOUNTER — Ambulatory Visit (INDEPENDENT_AMBULATORY_CARE_PROVIDER_SITE_OTHER): Payer: Medicare HMO | Admitting: *Deleted

## 2019-12-18 DIAGNOSIS — E538 Deficiency of other specified B group vitamins: Secondary | ICD-10-CM

## 2019-12-18 MED ORDER — CYANOCOBALAMIN 1000 MCG/ML IJ SOLN
1000.0000 ug | Freq: Once | INTRAMUSCULAR | Status: AC
  Administered 2019-12-18: 1000 ug via INTRAMUSCULAR

## 2019-12-31 NOTE — Progress Notes (Signed)
Agree. Thanks

## 2020-01-01 DIAGNOSIS — C44329 Squamous cell carcinoma of skin of other parts of face: Secondary | ICD-10-CM | POA: Diagnosis not present

## 2020-01-07 DIAGNOSIS — I739 Peripheral vascular disease, unspecified: Secondary | ICD-10-CM | POA: Diagnosis not present

## 2020-01-07 DIAGNOSIS — B351 Tinea unguium: Secondary | ICD-10-CM | POA: Diagnosis not present

## 2020-01-07 DIAGNOSIS — M2011 Hallux valgus (acquired), right foot: Secondary | ICD-10-CM | POA: Diagnosis not present

## 2020-01-07 DIAGNOSIS — M2012 Hallux valgus (acquired), left foot: Secondary | ICD-10-CM | POA: Diagnosis not present

## 2020-01-07 DIAGNOSIS — L84 Corns and callosities: Secondary | ICD-10-CM | POA: Diagnosis not present

## 2020-01-20 ENCOUNTER — Other Ambulatory Visit: Payer: Self-pay

## 2020-01-20 ENCOUNTER — Ambulatory Visit (INDEPENDENT_AMBULATORY_CARE_PROVIDER_SITE_OTHER): Payer: Medicare HMO

## 2020-01-20 DIAGNOSIS — E538 Deficiency of other specified B group vitamins: Secondary | ICD-10-CM

## 2020-01-20 MED ORDER — CYANOCOBALAMIN 1000 MCG/ML IJ SOLN
1000.0000 ug | Freq: Once | INTRAMUSCULAR | Status: AC
  Administered 2020-01-20: 1000 ug via INTRAMUSCULAR

## 2020-01-20 NOTE — Progress Notes (Signed)
Per orders of Dr. [name], injection of B12 given by Judeth Horn, CMA Patient tolerated injection well.

## 2020-02-25 ENCOUNTER — Other Ambulatory Visit: Payer: Self-pay

## 2020-02-25 ENCOUNTER — Ambulatory Visit (INDEPENDENT_AMBULATORY_CARE_PROVIDER_SITE_OTHER): Payer: Medicare HMO

## 2020-02-25 DIAGNOSIS — E538 Deficiency of other specified B group vitamins: Secondary | ICD-10-CM | POA: Diagnosis not present

## 2020-02-25 MED ORDER — CYANOCOBALAMIN 1000 MCG/ML IJ SOLN
1000.0000 ug | Freq: Once | INTRAMUSCULAR | Status: AC
  Administered 2020-02-25: 1000 ug via INTRAMUSCULAR

## 2020-02-25 NOTE — Progress Notes (Signed)
Per orders of Allie Bossier, NP, in Dr. Carole Civil absence, injection of B12 given by Randall An. Patient tolerated injection well.

## 2020-02-26 DIAGNOSIS — H11001 Unspecified pterygium of right eye: Secondary | ICD-10-CM | POA: Diagnosis not present

## 2020-02-26 DIAGNOSIS — H2512 Age-related nuclear cataract, left eye: Secondary | ICD-10-CM | POA: Diagnosis not present

## 2020-02-26 DIAGNOSIS — Z01818 Encounter for other preprocedural examination: Secondary | ICD-10-CM | POA: Diagnosis not present

## 2020-02-26 DIAGNOSIS — H21551 Recession of chamber angle, right eye: Secondary | ICD-10-CM | POA: Diagnosis not present

## 2020-02-26 DIAGNOSIS — H25811 Combined forms of age-related cataract, right eye: Secondary | ICD-10-CM | POA: Diagnosis not present

## 2020-03-01 DIAGNOSIS — M9902 Segmental and somatic dysfunction of thoracic region: Secondary | ICD-10-CM | POA: Diagnosis not present

## 2020-03-01 DIAGNOSIS — M542 Cervicalgia: Secondary | ICD-10-CM | POA: Diagnosis not present

## 2020-03-01 DIAGNOSIS — M9901 Segmental and somatic dysfunction of cervical region: Secondary | ICD-10-CM | POA: Diagnosis not present

## 2020-03-01 DIAGNOSIS — M5413 Radiculopathy, cervicothoracic region: Secondary | ICD-10-CM | POA: Diagnosis not present

## 2020-03-01 DIAGNOSIS — M546 Pain in thoracic spine: Secondary | ICD-10-CM | POA: Diagnosis not present

## 2020-03-02 DIAGNOSIS — M546 Pain in thoracic spine: Secondary | ICD-10-CM | POA: Diagnosis not present

## 2020-03-02 DIAGNOSIS — M9902 Segmental and somatic dysfunction of thoracic region: Secondary | ICD-10-CM | POA: Diagnosis not present

## 2020-03-02 DIAGNOSIS — M542 Cervicalgia: Secondary | ICD-10-CM | POA: Diagnosis not present

## 2020-03-02 DIAGNOSIS — M5413 Radiculopathy, cervicothoracic region: Secondary | ICD-10-CM | POA: Diagnosis not present

## 2020-03-02 DIAGNOSIS — M9901 Segmental and somatic dysfunction of cervical region: Secondary | ICD-10-CM | POA: Diagnosis not present

## 2020-03-04 DIAGNOSIS — M9901 Segmental and somatic dysfunction of cervical region: Secondary | ICD-10-CM | POA: Diagnosis not present

## 2020-03-04 DIAGNOSIS — M546 Pain in thoracic spine: Secondary | ICD-10-CM | POA: Diagnosis not present

## 2020-03-04 DIAGNOSIS — M5413 Radiculopathy, cervicothoracic region: Secondary | ICD-10-CM | POA: Diagnosis not present

## 2020-03-04 DIAGNOSIS — M9902 Segmental and somatic dysfunction of thoracic region: Secondary | ICD-10-CM | POA: Diagnosis not present

## 2020-03-04 DIAGNOSIS — M542 Cervicalgia: Secondary | ICD-10-CM | POA: Diagnosis not present

## 2020-03-08 DIAGNOSIS — M9901 Segmental and somatic dysfunction of cervical region: Secondary | ICD-10-CM | POA: Diagnosis not present

## 2020-03-08 DIAGNOSIS — M546 Pain in thoracic spine: Secondary | ICD-10-CM | POA: Diagnosis not present

## 2020-03-08 DIAGNOSIS — M542 Cervicalgia: Secondary | ICD-10-CM | POA: Diagnosis not present

## 2020-03-08 DIAGNOSIS — M5413 Radiculopathy, cervicothoracic region: Secondary | ICD-10-CM | POA: Diagnosis not present

## 2020-03-08 DIAGNOSIS — M9902 Segmental and somatic dysfunction of thoracic region: Secondary | ICD-10-CM | POA: Diagnosis not present

## 2020-03-30 ENCOUNTER — Ambulatory Visit: Payer: Medicare HMO

## 2020-04-07 ENCOUNTER — Ambulatory Visit (INDEPENDENT_AMBULATORY_CARE_PROVIDER_SITE_OTHER): Payer: Medicare HMO

## 2020-04-07 ENCOUNTER — Other Ambulatory Visit: Payer: Self-pay

## 2020-04-07 ENCOUNTER — Other Ambulatory Visit: Payer: Self-pay | Admitting: Family Medicine

## 2020-04-07 DIAGNOSIS — E538 Deficiency of other specified B group vitamins: Secondary | ICD-10-CM | POA: Diagnosis not present

## 2020-04-07 MED ORDER — CYANOCOBALAMIN 1000 MCG/ML IJ SOLN
1000.0000 ug | Freq: Once | INTRAMUSCULAR | Status: AC
  Administered 2020-04-07: 1000 ug via INTRAMUSCULAR

## 2020-04-07 NOTE — Progress Notes (Signed)
Per orders of Dr. Danise Mina, in Dr. Josefine Class absence, monthly injection of B12 given by Loreen Freud. Patient tolerated injection well.

## 2020-04-08 NOTE — Telephone Encounter (Signed)
Electronic refill request. Celebrex Last office visit:   05/30/2019 Last Filled:    30 capsule 12 05/30/2019

## 2020-04-09 NOTE — Telephone Encounter (Signed)
Sent. Thanks.  Needs yearly visit set up.

## 2020-04-13 NOTE — Telephone Encounter (Signed)
Patient advised.

## 2020-04-19 DIAGNOSIS — H11001 Unspecified pterygium of right eye: Secondary | ICD-10-CM | POA: Diagnosis not present

## 2020-05-03 DIAGNOSIS — H2512 Age-related nuclear cataract, left eye: Secondary | ICD-10-CM | POA: Diagnosis not present

## 2020-05-12 ENCOUNTER — Ambulatory Visit (INDEPENDENT_AMBULATORY_CARE_PROVIDER_SITE_OTHER): Payer: Medicare HMO

## 2020-05-12 DIAGNOSIS — E538 Deficiency of other specified B group vitamins: Secondary | ICD-10-CM

## 2020-05-12 MED ORDER — CYANOCOBALAMIN 1000 MCG/ML IJ SOLN
1000.0000 ug | Freq: Once | INTRAMUSCULAR | Status: AC
  Administered 2020-05-12: 1000 ug via INTRAMUSCULAR

## 2020-05-12 NOTE — Progress Notes (Signed)
Per orders of Dr. Danise Mina, in Dr. Josefine Class absence, injection of B12 given by Loreen Freud. Injections are monthly.  Patient tolerated injection well.

## 2020-05-16 ENCOUNTER — Other Ambulatory Visit: Payer: Self-pay | Admitting: Family Medicine

## 2020-05-16 DIAGNOSIS — R7989 Other specified abnormal findings of blood chemistry: Secondary | ICD-10-CM

## 2020-05-16 DIAGNOSIS — E538 Deficiency of other specified B group vitamins: Secondary | ICD-10-CM

## 2020-05-16 DIAGNOSIS — E786 Lipoprotein deficiency: Secondary | ICD-10-CM

## 2020-05-24 ENCOUNTER — Other Ambulatory Visit: Payer: Self-pay

## 2020-05-24 ENCOUNTER — Other Ambulatory Visit (INDEPENDENT_AMBULATORY_CARE_PROVIDER_SITE_OTHER): Payer: Medicare HMO

## 2020-05-24 DIAGNOSIS — R7989 Other specified abnormal findings of blood chemistry: Secondary | ICD-10-CM

## 2020-05-24 DIAGNOSIS — E538 Deficiency of other specified B group vitamins: Secondary | ICD-10-CM | POA: Diagnosis not present

## 2020-05-24 DIAGNOSIS — E786 Lipoprotein deficiency: Secondary | ICD-10-CM

## 2020-05-24 LAB — LIPID PANEL
Cholesterol: 133 mg/dL (ref 0–200)
HDL: 42.5 mg/dL (ref >39.00)
LDL Cholesterol: 80 mg/dL (ref 0–99)
NonHDL: 90.51
Total CHOL/HDL Ratio: 3
Triglycerides: 55 mg/dL (ref 0.0–149.0)
VLDL: 11 mg/dL (ref 0.0–40.0)

## 2020-05-24 LAB — COMPREHENSIVE METABOLIC PANEL
ALT: 23 U/L (ref 0–53)
AST: 19 U/L (ref 0–37)
Albumin: 4 g/dL (ref 3.5–5.2)
Alkaline Phosphatase: 54 U/L (ref 39–117)
BUN: 18 mg/dL (ref 6–23)
CO2: 28 mEq/L (ref 19–32)
Calcium: 9.1 mg/dL (ref 8.4–10.5)
Chloride: 106 mEq/L (ref 96–112)
Creatinine, Ser: 1.09 mg/dL (ref 0.40–1.50)
GFR: 65.04 mL/min (ref >60.00)
Glucose, Bld: 91 mg/dL (ref 70–99)
Potassium: 4.1 mEq/L (ref 3.5–5.1)
Sodium: 141 mEq/L (ref 135–145)
Total Bilirubin: 0.5 mg/dL (ref 0.2–1.2)
Total Protein: 6.5 g/dL (ref 6.0–8.3)

## 2020-05-24 LAB — TSH: TSH: 5.68 u[IU]/mL — ABNORMAL HIGH (ref 0.35–4.50)

## 2020-05-24 LAB — VITAMIN B12: Vitamin B-12: 385 pg/mL (ref 211–911)

## 2020-05-31 ENCOUNTER — Encounter: Payer: Self-pay | Admitting: Family Medicine

## 2020-05-31 ENCOUNTER — Ambulatory Visit (INDEPENDENT_AMBULATORY_CARE_PROVIDER_SITE_OTHER)
Admission: RE | Admit: 2020-05-31 | Discharge: 2020-05-31 | Disposition: A | Discharge status: Home / Self Care | Payer: Medicare HMO | Source: Ambulatory Visit | Attending: Family Medicine | Admitting: Family Medicine

## 2020-05-31 ENCOUNTER — Ambulatory Visit (INDEPENDENT_AMBULATORY_CARE_PROVIDER_SITE_OTHER): Payer: Medicare HMO | Admitting: Family Medicine

## 2020-05-31 ENCOUNTER — Other Ambulatory Visit: Payer: Self-pay

## 2020-05-31 VITALS — BP 140/70 | HR 80 | Temp 98.2°F | Ht 68.0 in | Wt 181.9 lb

## 2020-05-31 DIAGNOSIS — J439 Emphysema, unspecified: Secondary | ICD-10-CM | POA: Diagnosis not present

## 2020-05-31 DIAGNOSIS — Z23 Encounter for immunization: Secondary | ICD-10-CM | POA: Diagnosis not present

## 2020-05-31 DIAGNOSIS — M1990 Unspecified osteoarthritis, unspecified site: Secondary | ICD-10-CM | POA: Diagnosis not present

## 2020-05-31 DIAGNOSIS — R2689 Other abnormalities of gait and mobility: Secondary | ICD-10-CM | POA: Diagnosis not present

## 2020-05-31 DIAGNOSIS — K219 Gastro-esophageal reflux disease without esophagitis: Secondary | ICD-10-CM | POA: Diagnosis not present

## 2020-05-31 DIAGNOSIS — J449 Chronic obstructive pulmonary disease, unspecified: Secondary | ICD-10-CM

## 2020-05-31 DIAGNOSIS — E538 Deficiency of other specified B group vitamins: Secondary | ICD-10-CM | POA: Diagnosis not present

## 2020-05-31 DIAGNOSIS — R7989 Other specified abnormal findings of blood chemistry: Secondary | ICD-10-CM | POA: Diagnosis not present

## 2020-05-31 DIAGNOSIS — Z0000 Encounter for general adult medical examination without abnormal findings: Secondary | ICD-10-CM | POA: Diagnosis not present

## 2020-05-31 DIAGNOSIS — Z7189 Other specified counseling: Secondary | ICD-10-CM

## 2020-05-31 MED ORDER — SPIRIVA HANDIHALER 18 MCG IN CAPS
ORAL_CAPSULE | RESPIRATORY_TRACT | 12 refills | Status: DC
Start: 2020-05-31 — End: 2020-12-09

## 2020-05-31 MED ORDER — PANTOPRAZOLE SODIUM 20 MG PO TBEC
40.0000 mg | DELAYED_RELEASE_TABLET | Freq: Every day | ORAL | 12 refills | Status: DC
Start: 2020-05-31 — End: 2021-05-17

## 2020-05-31 MED ORDER — CELECOXIB 200 MG PO CAPS
ORAL_CAPSULE | ORAL | 3 refills | Status: DC
Start: 2020-05-31 — End: 2021-05-17

## 2020-05-31 MED ORDER — ALBUTEROL SULFATE HFA 108 (90 BASE) MCG/ACT IN AERS
1.0000 | INHALATION_SPRAY | Freq: Four times a day (QID) | RESPIRATORY_TRACT | 2 refills | Status: DC | PRN
Start: 2020-05-31 — End: 2021-05-17

## 2020-05-31 NOTE — Progress Notes (Signed)
This visit occurred during the SARS-CoV-2 public health emergency.  Safety protocols were in place, including screening questions prior to the visit, additional usage of staff PPE, and extensive cleaning of exam room while observing appropriate contact time as indicated for disinfecting solutions.  I have personally reviewed the Medicare Annual Wellness questionnaire and have noted 1. The patient's medical and social history 2. Their use of alcohol, tobacco or illicit drugs 3. Their current medications and supplements 4. The patient's functional ability including ADL's, fall risks, home safety risks and hearing or visual             impairment. 5. Diet and physical activities 6. Evidence for depression or mood disorders  The patients weight, height, BMI have been recorded in the chart and visual acuity is per eye clinic.  I have made referrals, counseling and provided education to the patient based review of the above and I have provided the pt with a written personalized care plan for preventive services.  Provider list updated- see scanned forms.  Routine anticipatory guidance given to patient.  See health maintenance. The possibility exists that previously documented standard health maintenance information may have been brought forward from a previous encounter into this note.  If needed, that same information has been updated to reflect the current situation based on today's encounter.    Flu today Shingles d/w pt. PNA UTD Tetanus UTD covid 2021.   Colonoscopy 2019 Prostate cancer screening and PSA options(with potential risks and benefits of testing vs not testing) were discussed along with recent recs/guidelines. He declined testing PSAat this point. Advance directive-wife theneither son equallydesignated if patient were incapactated.   Cognitive function addressed- see scanned forms- and if abnormal then additional documentation follows.  COPD.  Compliant with spiriva with  prn/rare SABA.  No sputum.  He can get winded with exercise, gradually worse in the last year or so.  No CP.  Follow-up chest x-ray pending.  See notes on imaging.  TSH minimally elevated.  Likely not a sig elevation, d/w pt.    Still with low normal B12 but improved from prev.  Injection done today.  Still on celebrex at baseline.  No ADE on med.   Still on PPI. GERD sx returned in the last few months.  No ADE on med.    Had R carpal tunnel syndrome surgery.  His pain is better but not resolved.  He is improved from previous.  He has occasional episodes where he will briefly feel like his balance is not as good.  He does not fall and noted to have vertigo.  He is not lightheaded.  He does not have any focal neurologic symptoms such as weakness on one side.  No symptoms now.  PMH and SH reviewed  Meds, vitals, and allergies reviewed.   ROS: Per HPI.  Unless specifically indicated otherwise in HPI, the patient denies:  General: fever. Eyes: acute vision changes ENT: sore throat Cardiovascular: chest pain Respiratory: SOB GI: vomiting GU: dysuria Musculoskeletal: acute back pain Derm: acute rash Neuro: acute motor dysfunction Psych: worsening mood Endocrine: polydipsia Heme: bleeding Allergy: hayfever  GEN: nad, alert and oriented HEENT: ncat NECK: supple w/o LA CV: rrr. PULM: ctab, no inc wob ABD: soft, +bs EXT: no edema SKIN: no acute rash CN 2-12 wnl B, S/S/DTR wnl B

## 2020-05-31 NOTE — Patient Instructions (Addendum)
Go to the lab on the way out.   If you have mychart we'll likely use that to update you.     You should be able to get the second shingles shot 30 days after the flu shot today.  Then wait another month before the booster shot.    Try doubling the stomach medicine and see if that helps.    Keep taking B12 shots monthly- please set that up.    See if you notice any triggers for the balance changes.   Take care.  Glad to see you.

## 2020-06-05 ENCOUNTER — Other Ambulatory Visit: Payer: Self-pay | Admitting: Family Medicine

## 2020-06-06 DIAGNOSIS — R2689 Other abnormalities of gait and mobility: Secondary | ICD-10-CM | POA: Insufficient documentation

## 2020-06-06 NOTE — Assessment & Plan Note (Signed)
Discussed with patient about continued B12 replacement.

## 2020-06-06 NOTE — Assessment & Plan Note (Signed)
Unclear source with benign neurologic exam today.  I asked him to monitor this in the meantime.

## 2020-06-06 NOTE — Assessment & Plan Note (Signed)
Flu today Shingles d/w pt. PNA UTD Tetanus UTD covid 2021.   Colonoscopy 2019 Prostate cancer screening and PSA options(with potential risks and benefits of testing vs not testing) were discussed along with recent recs/guidelines. He declined testing PSAat this point. Advance directive-wife theneither son equallydesignated if patient were incapactated.   Cognitive function addressed- see scanned forms- and if abnormal then additional documentation follows.

## 2020-06-06 NOTE — Assessment & Plan Note (Signed)
Advance directive-wife theneither son equallydesignated if patient were incapactated.

## 2020-06-06 NOTE — Assessment & Plan Note (Signed)
Continue Celebrex. 

## 2020-06-06 NOTE — Assessment & Plan Note (Signed)
Reasonable to increase his Protonix to 2 pills daily and see if that makes a difference.  He will update me as needed.

## 2020-06-06 NOTE — Assessment & Plan Note (Signed)
TSH minimally elevated.  Likely not a sig elevation, d/w pt. we can follow this episodically.

## 2020-06-06 NOTE — Assessment & Plan Note (Signed)
He has a very gradual decrease in exercise tolerance over the last year.  Reasonable to recheck chest x-ray.  See notes on imaging.  Continue Spiriva with as needed albuterol as needed.

## 2020-06-07 DIAGNOSIS — H2511 Age-related nuclear cataract, right eye: Secondary | ICD-10-CM | POA: Diagnosis not present

## 2020-06-07 DIAGNOSIS — H25811 Combined forms of age-related cataract, right eye: Secondary | ICD-10-CM | POA: Diagnosis not present

## 2020-06-08 DIAGNOSIS — H2512 Age-related nuclear cataract, left eye: Secondary | ICD-10-CM | POA: Diagnosis not present

## 2020-06-10 ENCOUNTER — Ambulatory Visit (INDEPENDENT_AMBULATORY_CARE_PROVIDER_SITE_OTHER): Payer: Medicare HMO

## 2020-06-10 ENCOUNTER — Other Ambulatory Visit: Payer: Self-pay

## 2020-06-10 DIAGNOSIS — E538 Deficiency of other specified B group vitamins: Secondary | ICD-10-CM

## 2020-06-10 MED ORDER — CYANOCOBALAMIN 1000 MCG/ML IJ SOLN
1000.0000 ug | Freq: Once | INTRAMUSCULAR | Status: AC
  Administered 2020-06-10: 1000 ug via INTRAMUSCULAR

## 2020-06-10 NOTE — Progress Notes (Signed)
Per orders of Dr. Duncan, injection of B12, given by Maron Stanzione, RN. Patient tolerated injection well in R Deltoid.      

## 2020-07-14 ENCOUNTER — Ambulatory Visit (INDEPENDENT_AMBULATORY_CARE_PROVIDER_SITE_OTHER): Payer: Medicare HMO

## 2020-07-14 DIAGNOSIS — E538 Deficiency of other specified B group vitamins: Secondary | ICD-10-CM

## 2020-07-14 MED ORDER — CYANOCOBALAMIN 1000 MCG/ML IJ SOLN
1000.0000 ug | Freq: Once | INTRAMUSCULAR | Status: AC
  Administered 2020-07-14: 1000 ug via INTRAMUSCULAR

## 2020-07-14 NOTE — Progress Notes (Signed)
Per orders of Dr. Sharen Hones in the abcense of Dr. Para March, injection of B12, given by Selina Cooley, RN. Patient tolerated injection well in L Deltoid.

## 2020-07-15 DIAGNOSIS — H524 Presbyopia: Secondary | ICD-10-CM | POA: Diagnosis not present

## 2020-07-15 DIAGNOSIS — H52223 Regular astigmatism, bilateral: Secondary | ICD-10-CM | POA: Diagnosis not present

## 2020-07-15 DIAGNOSIS — Z961 Presence of intraocular lens: Secondary | ICD-10-CM | POA: Diagnosis not present

## 2020-07-15 DIAGNOSIS — H25811 Combined forms of age-related cataract, right eye: Secondary | ICD-10-CM | POA: Diagnosis not present

## 2020-07-21 ENCOUNTER — Telehealth: Payer: Self-pay

## 2020-07-21 NOTE — Telephone Encounter (Signed)
If he does not have clearly progressive symptoms and if he is not short of breath then I think it makes sense to continue with routine home care with rest and fluids and Tylenol as needed for fever.  I will await his COVID test.  If he is feeling worse in the meantime then please call the triage line/seek evaluation.  Please have him update [place] with his test results/symptoms.  Thanks.

## 2020-07-21 NOTE — Telephone Encounter (Signed)
[facility] - Client TELEPHONE ADVICE RECORD [facility] Patient Name: Ricardo Wilson Gender: Male DOB: 10/28/1940 Age: 79 Y 7 M 4 D Return Phone Number: [phone] (Primary) Address: City/State/Zip: [place] [address] [name] Client Site [facility] - Day Physician Duncan, Shaw - MD Contact Type Call Who Is Calling Patient / Member / Family / Caregiver Call Type Triage / Clinical Caller Name Kerry Relationship To Patient Spouse Return Phone Number [phone] (Primary) Chief Complaint Headache Reason for Call Symptomatic / Request for Health Information Initial Comment Caller statesher husband has a headache and sore throat. Translation No Nurse Assessment Nurse: Womble, RN, Deborah Date/Time (Eastern Time): 07/21/2020 2:50:14 PM Confirm and document reason for call. If symptomatic, describe symptoms. ---The caller states that her husband has had a headache and sore throat since Saturday. Tested for Monday and expect results tomorrow. Fever of 100.6 orally and now is 99.7. Feels fatigued and no appetite. Does the patient have any new or worsening symptoms? ---Yes Will a triage be completed? ---Yes Related visit to physician within the last 2 weeks? ---No Does the PT have any chronic conditions? (i.e. diabetes, asthma, this includes High risk factors for pregnancy, etc.) ---Yes List chronic conditions. ---lung reduction Is this a behavioral health or substance abuse call? ---No Guidelines Guideline Title Affirmed Question Affirmed Notes Nurse Date/Time (Eastern Time) COVID-19 - Diagnosed or Suspected [1] COVID-19 diagnosed by positive lab test (e.g., PCR, rapid selftest kit) AND [2] mild symptoms (e.g., cough, fever, others) AND [3] no complications or SOB Womble, RN, Deborah 07/21/2020 2:53:04 PM Disp. Time (Eastern Time) Disposition Final User 07/21/2020 3:01:40 PM  Home Care Yes Womble, RN, Deborah PLEASE NOTE: All timestamps contained within this report are represented as Eastern Standard Time. CONFIDENTIALTY NOTICE: This fax transmission is intended only for the addressee. It contains information that is legally privileged, confidential or otherwise protected from use or disclosure. If you are not the intended recipient, you are strictly prohibited from reviewing, disclosing, copying using or disseminating any of this information or taking any action in reliance on or regarding this information. If you have received this fax in error, please notify us immediately by telephone so that we can arrange for its return to us. Phone: [phone], Toll-Free: [phone], Fax: [phone] Page: 2 of 2 Call Id: [id number] Caller Disagree/Comply Comply Caller Understands Yes PreDisposition Home Care Care Advice Given Per Guideline * You should be able to treat this at home. * Fever lasts over 3 days * Chest pain or difficulty breathing occurs * You become worse 

## 2020-07-22 NOTE — Telephone Encounter (Signed)
Spoke with pt and he stated that he is feeling better and that his fever is back down to normal and that he would keep [place] updated on his progress.  [name]

## 2020-07-23 NOTE — Telephone Encounter (Signed)
Agreed.  Thanks.  

## 2020-08-02 ENCOUNTER — Other Ambulatory Visit: Payer: Self-pay

## 2020-08-02 ENCOUNTER — Encounter: Payer: Self-pay | Admitting: Internal Medicine

## 2020-08-02 ENCOUNTER — Telehealth: Payer: Self-pay

## 2020-08-02 ENCOUNTER — Telehealth (INDEPENDENT_AMBULATORY_CARE_PROVIDER_SITE_OTHER): Payer: Medicare HMO | Admitting: Internal Medicine

## 2020-08-02 DIAGNOSIS — J441 Chronic obstructive pulmonary disease with (acute) exacerbation: Secondary | ICD-10-CM | POA: Diagnosis not present

## 2020-08-02 MED ORDER — PREDNISONE 20 MG PO TABS: 40.0000 mg | ORAL_TABLET | Freq: Every day | ORAL | 0 refills | Status: DC

## 2020-08-02 NOTE — Telephone Encounter (Signed)
Pt had VV with Dr. Silvio Pate today.

## 2020-08-02 NOTE — Telephone Encounter (Signed)
[facility] - Client TELEPHONE ADVICE RECORD [facility] Patient Name: Ricardo Wilson Gender: Male DOB: [birth year] Age: 80 Y 32 M 16 D Return Phone Number: [phone] (Primary), [phone] (Secondary) Address: City/State/Zip: [place] [address] Client [facility] - Client Client Site [place] - Day Physician Renford Dills - MD Contact Type Call Who Is Calling Patient / Member / Family / Caregiver Call Type Triage / Clinical Relationship To Patient Self Return Phone Number [phone] (Primary) Chief Complaint BREATHING - shortness of breath or sounds breathless Reason for Call Symptomatic / Request for [place] states he has a sore throat with shortness of breath. Translation No Nurse Assessment Nurse: Rock Nephew, RN, Juliann Pulse Date/Time (Eastern Time): 08/02/2020 2:33:40 PM Confirm and document reason for call. If symptomatic, describe symptoms. ---Caller states he has a sore throat with shortness of breath. Does the patient have any new or worsening symptoms? ---Yes Will a triage be completed? ---Yes Related visit to physician within the last 2 weeks? ---No Does the PT have any chronic conditions? (i.e. diabetes, asthma, this includes High risk factors for pregnancy, etc.) ---Yes List chronic conditions. ---Emphysema Is this a behavioral health or substance abuse call? ---No Guidelines Guideline Title Affirmed Question Affirmed Notes Nurse Date/Time Eilene Ghazi Time) Breathing Difficulty [1] MILD difficulty breathing (e.g., minimal/ no SOB at rest, SOB with walking, pulse <100) AND [2] NEW-onset or WORSE than normal Wells, RN, Juliann Pulse 08/02/2020 2:34:32 PM Disp. Time Eilene Ghazi Time) Disposition Final User 08/02/2020 2:31:58 PM Send to Urgent Queue Memory Argue 08/02/2020 2:40:13 PM See HCP within 4 Hours (or PCP triage) Yes Rock Nephew, RN, Juliann Pulse PLEASE NOTE: All  timestamps contained within this report are represented as [place] Standard Time. CONFIDENTIALTY NOTICE: This fax transmission is intended only for the addressee. It contains information that is legally privileged, confidential or otherwise protected from use or disclosure. If you are not the intended recipient, you are strictly prohibited from reviewing, disclosing, copying using or disseminating any of this information or taking any action in reliance on or regarding this information. If you have received this fax in error, please notify us immediately by telephone so that we can arrange for its return to [place]. Phone: [phone], Toll-Free: [phone], Fax: [phone] Page: 2 of 2 Call Id: [id number] [place] Disagree/Comply Comply Caller Understands Yes PreDisposition Call Doctor Care Advice Given Per Guideline SEE HCP (OR PCP TRIAGE) WITHIN 4 HOURS: * IF OFFICE WILL BE OPEN: You need to be seen within the next 3 or 4 hours. Call your doctor (or NP/PA) now or as soon as the office opens. CALL BACK IF: * You become worse CARE ADVICE given per Breathing Difficulty (Adult) guideline. Comments User: Valetta Mole, RN Date/Time Eilene Ghazi Time): 08/02/2020 2:39:45 PM Caller warm transferred to [facility] for appt Referrals REFERRED TO PCP OFFICE

## 2020-08-02 NOTE — Assessment & Plan Note (Signed)
Clear exacerbation ---likely due to mild COVID infection On spiriva but now using albuterol multiple times a day Will try prednisone x 10 days Nothing to suggest pneumonia or any need for antibiotics If not better within 2 days---will need visit in office (can be now since he is 2 weeks out from positive test)  Total time of visit 15 minutes

## 2020-08-02 NOTE — Progress Notes (Signed)
Subjective:    Patient ID: Ricardo Cove., male    DOB: [birth year], 80 y.o.   MRN: [id number]  HPI Telephone virtual visit due to shortness of breath Identification done Reviewed limitations and billing and he gave consent Participants--patient in his home (wife in background occasionally helping him with details) and I am in my office  Did test positive for COVID on 1/10 No symptoms but his family wanted him tested Wife also positive Some sore throat since then --still present Occasional headaches Coughing only occasionally --rare sputum Some tight feeling in chest Breathing is not at his baseline---SOB just walking 125 feet up hill slightly to mailbox (usually could do this) Has to rest before coming back Mostly noticed in the last 7-10 days Uses the spiriva daily Now using the albuterol regularly--- 6-8 times a day recently  No fever now---had low grade a while back for 2 days Oxygen levels in 90's (may be 88% at times--then comes up)  Current Outpatient Medications on File Prior to Visit  Medication Sig Dispense Refill  . albuterol (VENTOLIN HFA) 108 (90 Base) MCG/ACT inhaler Inhale 1-2 puffs into the lungs every 6 (six) hours as needed for wheezing or shortness of breath. Ok to fill either proair HFA or ventolin 18 g 2  . celecoxib (CELEBREX) 200 MG capsule 1 tab daily by mouth as needed. 90 capsule 3  . cyanocobalamin (,VITAMIN B-12,) 1000 MCG/ML injection 1000 mcg injection IM monthly    . pantoprazole (PROTONIX) 20 MG tablet Take 2 tablets (40 mg total) by mouth daily. 60 tablet 12  . tiotropium (SPIRIVA HANDIHALER) 18 MCG inhalation capsule PLACE 1 CAPSULE INTO INHALER AND INHALE DAILY 30 capsule 12   No current facility-administered medications on file prior to visit.    Allergies  Allergen Reactions  . Aspirin Nausea And Vomiting    As a child    Past Medical History:  Diagnosis Date  . Arthritis   . COPD (chronic obstructive pulmonary disease) ([place])   .  GERD (gastroesophageal reflux disease)     Past Surgical History:  Procedure Laterality Date  . APPENDECTOMY    . CARPAL TUNNEL RELEASE Right   . COLONOSCOPY  2013   tubular adenoma x2  . INGUINAL HERNIA REPAIR Left 01/23/2016   Procedure: LEFT HERNIA REPAIR INGUINAL INCARCERATED;  Surgeon: Donnie Mesa, MD;  Location: [facility];  Service: General;  Laterality: Left;  . lung resection    . ROTATOR CUFF REPAIR Bilateral     Family History  Problem Relation Age of Onset  . Uterine cancer Mother   . Alcohol abuse Father   . Diabetes Father   . Colon cancer Neg Hx   . Prostate cancer Neg Hx     Social History   Socioeconomic History  . Marital status: Married    Spouse name: Not on file  . Number of children: Not on file  . Years of education: Not on file  . Highest education level: Not on file  Occupational History  . Not on file  Tobacco Use  . Smoking status: Former Research scientist (life sciences)  . Smokeless tobacco: Never Used  . Tobacco comment: quit 1986  Substance and Sexual Activity  . Alcohol use: Yes    Comment: occ  . Drug use: No  . Sexual activity: Not on file  Other Topics Concern  . Not on file  Social History Narrative   N[employer], [employer], [employer] fan   Married 50+ years   2 kids,  both in [place]   Retired from Programmer, applications work   Social Determinants of Radio broadcast assistant Strain: Not on [employer] Insecurity: Not on file  Transportation Needs: Not on file  Physical Activity: Not on file  Stress: Not on file  Social Connections: Not on file  Intimate Partner Violence: Not on file   Review of Systems No N/V Eating okay Sleeping okay--better now actualy    Objective:   Physical Exam Constitutional:      Comments: Normal conversation  Pulmonary:     Effort: Pulmonary effort is normal. No respiratory distress.  Neurological:     Mental Status: He is alert.            Assessment & Plan:

## 2020-08-03 NOTE — Telephone Encounter (Signed)
Noted. Thanks.

## 2020-08-06 DIAGNOSIS — R0602 Shortness of breath: Secondary | ICD-10-CM | POA: Diagnosis not present

## 2020-08-06 DIAGNOSIS — J439 Emphysema, unspecified: Secondary | ICD-10-CM | POA: Diagnosis not present

## 2020-08-06 DIAGNOSIS — Z8616 Personal history of COVID-19: Secondary | ICD-10-CM | POA: Diagnosis not present

## 2020-08-06 DIAGNOSIS — Z87891 Personal history of nicotine dependence: Secondary | ICD-10-CM | POA: Diagnosis not present

## 2020-08-06 DIAGNOSIS — Z9889 Other specified postprocedural states: Secondary | ICD-10-CM | POA: Diagnosis not present

## 2020-08-06 DIAGNOSIS — R918 Other nonspecific abnormal finding of lung field: Secondary | ICD-10-CM | POA: Diagnosis not present

## 2020-08-06 NOTE — Telephone Encounter (Signed)
[facility] - Client TELEPHONE ADVICE RECORD [facility] Patient Name: Ricardo Wilson Gender: Male DOB: 08-09-1940 Age: 80 Y 56 M 20 D Return Phone Number: [phone] (Primary), [phone] (Secondary) Address: City/State/Zip: [place] [address] Client [name] - Client Client Site [place] Physician Renford Dills - MD Contact Type Call Who Is Calling Patient / Member / Family / Caregiver Call Type Triage / Clinical Caller Name Janson Lamar Relationship To Patient Spouse Return Phone Number [phone] (Primary) Chief Complaint BREATHING - shortness of breath or sounds breathless Reason for Call Symptomatic / Request for [place] states that her husband is having difficulty breathing. Pt was given some prednisone on Monday. Nothing is better and is just getting worse. Translation No Nurse Assessment Nurse: Loletha Carrow, RN, Ronalee Belts Date/Time (Eastern Time): 08/06/2020 8:34:03 AM Confirm and document reason for call. If symptomatic, describe symptoms. ---Caller states: Husband had a virtual visit Monday for DIB and was prescribed Steroids. Symptoms are continuing to get worse. short of breath at rest. COVID + 1/10 Denies any other symptoms Does the patient have any new or worsening symptoms? ---Yes Will a triage be completed? ---Yes Related visit to physician within the last 2 weeks? ---Yes Does the PT have any chronic conditions? (i.e. diabetes, asthma, this includes High risk factors for pregnancy, etc.) ---No Is this a behavioral health or substance abuse call? ---No Guidelines Guideline Title Affirmed Question Affirmed Notes Nurse Date/Time (Eastern Time) Breathing Difficulty Patient sounds very sick or weak to the triager Emch, RN, Ronalee Belts 08/06/2020 8:37:28 AM Disp. Time Eilene Ghazi Time) Disposition Final User 08/06/2020 8:30:57 AM Send to Urgent Queue Cristal Ford 08/06/2020 8:44:33 AM Go to ED Now (or PCP triage) Yes Emch, RN, Natasha Mead NOTE: All timestamps contained within this report are represented as [place] Standard Time. CONFIDENTIALTY NOTICE: This fax transmission is intended only for the addressee. It contains information that is legally privileged, confidential or otherwise protected from use or disclosure. If you are not the intended recipient, you are strictly prohibited from reviewing, disclosing, copying using or disseminating any of this information or taking any action in reliance on or regarding this information. If you have received this fax in error, please notify us immediately by telephone so that we can arrange for its return to [place]. Phone: [phone], Toll-Free: [phone], Fax: [phone] Page: 2 of 2 Call Id: [id number] [place] Disagree/Comply Disagree Caller Understands Yes PreDisposition Did not know what to do Care Advice Given Per Guideline GO TO ED NOW (OR PCP TRIAGE): ANOTHER ADULT SHOULD DRIVE: * It is better and safer if another adult drives instead of you. CARE ADVICE given per Breathing Difficulty (Adult) guideline. Comments User: Doretha Sou, RN Date/Time Eilene Ghazi Time): 08/06/2020 8:40:08 AM caller has no answers to my questioning Referrals [facility] REFUSED

## 2020-08-06 NOTE — Telephone Encounter (Signed)
I spoke with pt; pt said he cannot get his breath; pt said he gives out of air. Pt has been taking prednisone since 08/02/20. Pt having CP all the way across upper chest; no sharp or dull pain but a "solid pain or pressure that is continuous". Pt is also wheezing. No available appts at [facility]. Pt said he is going to go to [facility] for eval and testing. Pt declined 911 and said that he will drive himself; I advised pt in his present condition he should not drive and pt said he had no one else to take him. Pt said he would be OK to drive that his wife would be with him. I advised pt again that with his present symptoms he should not drive and offered to call 911 but pt declined and he did say if he was unable to drive he would pull over and call 911. Sending note to Dr [name].

## 2020-08-08 NOTE — Telephone Encounter (Signed)
Please get update on patient.  I saw that he was evaluated in the emergency room and discharged home.  They mention pulmonary referral.  If he has not heard about that then please let me know.  I can put in a referral if needed.  ER chest CT report mentions repeat CT scan in 3 months.  I can order this if pulmonary does not.  Thanks.

## 2020-08-09 ENCOUNTER — Telehealth: Payer: Self-pay

## 2020-08-09 ENCOUNTER — Ambulatory Visit: Payer: Medicare HMO | Admitting: Family Medicine

## 2020-08-09 ENCOUNTER — Other Ambulatory Visit: Payer: Self-pay

## 2020-08-09 ENCOUNTER — Encounter: Payer: Self-pay | Admitting: Family Medicine

## 2020-08-09 ENCOUNTER — Telehealth: Payer: Medicare HMO | Admitting: Family Medicine

## 2020-08-09 DIAGNOSIS — J441 Chronic obstructive pulmonary disease with (acute) exacerbation: Secondary | ICD-10-CM | POA: Diagnosis not present

## 2020-08-09 NOTE — Telephone Encounter (Signed)
Pt wife called in and stated that he needed to be seen today.  And informed her that we are booked

## 2020-08-09 NOTE — Telephone Encounter (Signed)
See notes on message from Access Nurse.

## 2020-08-09 NOTE — Telephone Encounter (Signed)
Will d/w pt.  He was given results of CXR prev and inhalers were sent 05/31/20, the day of the yearly visit.

## 2020-08-09 NOTE — Telephone Encounter (Signed)
[facility] - Client TELEPHONE ADVICE RECORD [facility] Patient Name: Ricardo Wilson Gender: Male DOB: [birth year] Age: 80 Y 69 M 21 D Return Phone Number: [phone] (Primary), [phone] (Secondary) Address: City/State/Zip: [place] [address] Client [facility] - Client Client Site [place] Physician Renford Dills - MD Contact Type Call Who Is Calling Patient / Member / Family / Caregiver Call Type Triage / Clinical Caller Name Levada Dy Relationship To Patient Spouse Return Phone Number [phone] (Primary) Chief Complaint Unclassified Symptom Reason for Call Symptomatic / Request for [place] states her husband was told by the Dr yesterday to go to the ER for shortness of breath. Tested positive for COVID on Jan.10th. Still showing some COVID in his lungs, would like to know is he still contagious? Translation No Nurse Assessment Nurse: Mariea Clonts, RN, Brandi Date/Time (Eastern Time): 08/07/2020 5:19:59 PM Confirm and document reason for call. If symptomatic, describe symptoms. ---Caller states her husband was told by the Dr yesterday to go to the ER for shortness of breath-went to [facility] ER. Bloodwork, xrays and ct scan. Tested positive for COVID on Jan.10th. Still showing some COVID in his lungs, would like to know is he still contagious? No symptoms at this time-shortness of breathe has improved. Does the patient have any new or worsening symptoms? ---Yes Will a triage be completed? ---Yes Related visit to physician within the last 2 weeks? ---Yes Does the PT have any chronic conditions? (i.e. diabetes, asthma, this includes High risk factors for pregnancy, etc.) ---Yes List chronic conditions. ---emphysema Is this a behavioral health or substance abuse call? ---No Disp. Time Eilene Ghazi Time) Disposition Final User 08/07/2020 5:29:37 PM Clinical Call Yes  Mariea Clonts, RN, Brandi Comments User: Gaylyn Lambert, RN Date/Time Eilene Ghazi Time): 08/07/2020 5:29:25 PM PLEASE NOTE: All timestamps contained within this report are represented as [place] Standard Time. CONFIDENTIALTY NOTICE: This fax transmission is intended only for the addressee. It contains information that is legally privileged, confidential or otherwise protected from use or disclosure. If you are not the intended recipient, you are strictly prohibited from reviewing, disclosing, copying using or disseminating any of this information or taking any action in reliance on or regarding this information. If you have received this fax in error, please notify us immediately by telephone so that we can arrange for its return to [place]. Phone: [phone], Toll-Free: [phone], Fax: [phone] Page: 2 of 2 Call Id: [id number] Comments Declined triage. States he is going to try to get into PCP Monday. States SOB is improved since being seen in [facility] ED.

## 2020-08-09 NOTE — Progress Notes (Signed)
This visit occurred during the SARS-CoV-2 public health emergency.  Safety protocols were in place, including screening questions prior to the visit, additional usage of staff PPE, and extensive cleaning of exam room while observing appropriate contact time as indicated for disinfecting solutions.  ER f/u.  He got tested for covid.  He was positive.  He was then getting more SOB, needed more SABA in the meantime, more than his baseline.  Still on spiriva.  He would the emergency room, with abnormal imaging noted. He has pulmonary referral pending per his report.  He is in the midst of prednisone taper.  CT:  Impression:  1. No pulmonary embolism.  2. Severe upper lobe predominant emphysema.  3. Lower lobe subpleural groundglass with possible associated reticulation. Lucencies within the groundglass may represent regions of underlying emphysema although scattered foci of bronchiolectasis cannot be entirely excluded. Chronicity of the process is unclear. Given recent history of Covid 74, it is possible that the lower lobe findings represent Covid 19  infection. However, a more chronic process including interstitial lung disease is in the differential. Suggest short-term (3 months) follow-up if there are no interval outside prior chest CTs for comparison..   Using SABA now about 4-6 puffs per day.  He thought his breathing may be better than prev.   Meds, vitals, and allergies reviewed.   ROS: Per HPI unless specifically indicated in ROS section   GEN: nad, alert and oriented HEENT: ncat NECK: supple w/o LA CV: rrr.   PULM: ctab except for Scant rhonchi, no inc wob, speaking in complete sentences.  No focal decrease in breath sounds. ABD: soft, +bs EXT: no edema SKIN: Well-perfused.   I rechecked his pulse, 90.  Recheck pulse ox 95% on room air.

## 2020-08-09 NOTE — Patient Instructions (Addendum)
Update me if you don't get a call about seeing pulmonary soon.  Finish the prednisone.  You should gradually need the albuterol less as the days go by.   Keep using spiriva.  If worse, then let me know.   Take care.  Glad to see you.

## 2020-08-09 NOTE — Telephone Encounter (Signed)
Patient's wife was transferred to triage because they were told in the ER at discharge that patient needed to see Dr. [name] in two days. Spoke to patient's wife and was advised that her husband is not any worse but wants to be seen today. Patient's wife stated that they are waiting on a referral from the ER to see a pulmonologist because his COPD has gotten worse. Patient's wife stated that her husband tested postivie for covid on 07/19/20. Patient's wife wanted to know why her husband did not get a call on his chest x-ray that was done back in November. Advised patient's wife that her husband was given the results by our nurse. Patient's wife gave the phone to her husband and the notes were read to him and he stated that he does think that he got the results. Patient stated that he wants a copy of his chest x-rays done here at the office to compare to the CT scan that was done. Advised patient that the chest x-rays done here can be burred onto a CD for him to take with him for his appointment with a pulmonologist. Patient stated that he also wants to know why one of his inhalers was not renew when he had his physical. Patient stated that he has not gotten any worse since the ER visit but he is still a little dizzy and having SOB when he bends over or moves around. Patient was advised that Dr. [name] can add him on the end of the day at 4:30 today. Patient stated that he will be here.

## 2020-08-11 NOTE — Assessment & Plan Note (Signed)
We talked about his previous x-rays here, reviewed with patient at the office visit. We talked about previous triage call.  It was not appropriate for the patient to come into the clinic previously.  He was sick enough to need to be seen in the emergency room.  He was ill enough to need a CAT scan, which we do not have the ability to do in this facility.  He understood.  He is frustrated about not feeling well and I am sympathetic to that point. Advised regarding the following following-he will update me if he does not get a call about the pulmonary referral soon. Finish the prednisone.  He should gradually need the albuterol less as the days go by.   Keep using spiriva.  If worse, then let me know.   Okay for outpatient follow-up.  He agrees to plan. 30 minutes were devoted to patient care in this encounter (this includes time spent reviewing the patient's file/history, interviewing and examining the patient, counseling/reviewing plan with patient).

## 2020-08-24 ENCOUNTER — Other Ambulatory Visit: Payer: Self-pay

## 2020-08-24 ENCOUNTER — Ambulatory Visit (INDEPENDENT_AMBULATORY_CARE_PROVIDER_SITE_OTHER): Payer: Medicare HMO

## 2020-08-24 DIAGNOSIS — E538 Deficiency of other specified B group vitamins: Secondary | ICD-10-CM | POA: Diagnosis not present

## 2020-08-24 MED ORDER — CYANOCOBALAMIN 1000 MCG/ML IJ SOLN
1000.0000 ug | Freq: Once | INTRAMUSCULAR | Status: AC
  Administered 2020-08-24: 1000 ug via INTRAMUSCULAR

## 2020-08-24 NOTE — Progress Notes (Signed)
Per orders of Dr. [name], injection of B12, given by Aneta Mins, RN. Patient tolerated injection well in R Deltoid.

## 2020-09-14 DIAGNOSIS — J449 Chronic obstructive pulmonary disease, unspecified: Secondary | ICD-10-CM | POA: Diagnosis not present

## 2020-09-28 ENCOUNTER — Other Ambulatory Visit: Payer: Self-pay

## 2020-09-28 ENCOUNTER — Ambulatory Visit (INDEPENDENT_AMBULATORY_CARE_PROVIDER_SITE_OTHER): Payer: Medicare HMO

## 2020-09-28 DIAGNOSIS — E538 Deficiency of other specified B group vitamins: Secondary | ICD-10-CM | POA: Diagnosis not present

## 2020-09-28 MED ORDER — CYANOCOBALAMIN 1000 MCG/ML IJ SOLN
1000.0000 ug | Freq: Once | INTRAMUSCULAR | Status: AC
  Administered 2020-09-28: 1000 ug via INTRAMUSCULAR

## 2020-09-28 NOTE — Progress Notes (Signed)
Per orders of Dr. [name], monthly injection of B12 1073mcg/ml given by Beatriz Stallion, CMA in left deltoid. Patient tolerated injection well.

## 2020-11-02 ENCOUNTER — Other Ambulatory Visit: Payer: Self-pay

## 2020-11-02 ENCOUNTER — Ambulatory Visit (INDEPENDENT_AMBULATORY_CARE_PROVIDER_SITE_OTHER): Payer: Medicare HMO

## 2020-11-02 DIAGNOSIS — E538 Deficiency of other specified B group vitamins: Secondary | ICD-10-CM

## 2020-11-02 MED ORDER — CYANOCOBALAMIN 1000 MCG/ML IJ SOLN
1000.0000 ug | Freq: Once | INTRAMUSCULAR | Status: AC
  Administered 2020-11-02: 1000 ug via INTRAMUSCULAR

## 2020-11-02 NOTE — Progress Notes (Signed)
Per orders of Dr. Elsie Stain, injection of B12 given Right Deltoid by Lurlean Nanny.  Patient tolerated injection well.

## 2020-11-21 ENCOUNTER — Other Ambulatory Visit: Payer: Self-pay | Admitting: Family Medicine

## 2020-11-21 ENCOUNTER — Telehealth: Payer: Self-pay | Admitting: Family Medicine

## 2020-11-21 DIAGNOSIS — R911 Solitary pulmonary nodule: Secondary | ICD-10-CM

## 2020-11-21 NOTE — Telephone Encounter (Signed)
Notify patient.  Due for follow-up CT chest.  41-month follow-up from imaging done in January.  I put in the order.  He should get a call about scheduling.  Thanks.

## 2020-11-22 NOTE — Telephone Encounter (Signed)
Called patient reviewed all information and repeated back to me. Will call if any questions.  ? ?

## 2020-11-29 ENCOUNTER — Other Ambulatory Visit: Payer: Self-pay

## 2020-11-29 ENCOUNTER — Ambulatory Visit (INDEPENDENT_AMBULATORY_CARE_PROVIDER_SITE_OTHER)
Admission: RE | Admit: 2020-11-29 | Discharge: 2020-11-29 | Disposition: A | Discharge status: Home / Self Care | Payer: Medicare HMO | Source: Ambulatory Visit | Attending: Family Medicine | Admitting: Family Medicine

## 2020-11-29 DIAGNOSIS — R911 Solitary pulmonary nodule: Secondary | ICD-10-CM | POA: Diagnosis not present

## 2020-11-29 DIAGNOSIS — J438 Other emphysema: Secondary | ICD-10-CM | POA: Diagnosis not present

## 2020-11-29 DIAGNOSIS — J432 Centrilobular emphysema: Secondary | ICD-10-CM | POA: Diagnosis not present

## 2020-11-29 DIAGNOSIS — M47819 Spondylosis without myelopathy or radiculopathy, site unspecified: Secondary | ICD-10-CM | POA: Diagnosis not present

## 2020-11-29 DIAGNOSIS — I7789 Other specified disorders of arteries and arterioles: Secondary | ICD-10-CM | POA: Diagnosis not present

## 2020-12-02 ENCOUNTER — Other Ambulatory Visit: Payer: Self-pay | Admitting: Family Medicine

## 2020-12-02 DIAGNOSIS — Q2579 Other congenital malformations of pulmonary artery: Secondary | ICD-10-CM

## 2020-12-02 DIAGNOSIS — R9389 Abnormal findings on diagnostic imaging of other specified body structures: Secondary | ICD-10-CM

## 2020-12-07 ENCOUNTER — Ambulatory Visit (INDEPENDENT_AMBULATORY_CARE_PROVIDER_SITE_OTHER): Payer: Medicare HMO

## 2020-12-07 DIAGNOSIS — E538 Deficiency of other specified B group vitamins: Secondary | ICD-10-CM

## 2020-12-07 MED ORDER — CYANOCOBALAMIN 1000 MCG/ML IJ SOLN
1000.0000 ug | Freq: Once | INTRAMUSCULAR | Status: AC
  Administered 2020-12-07: 1000 ug via INTRAMUSCULAR

## 2020-12-07 NOTE — Progress Notes (Signed)
Patient presented for B 12 injection given by Anjelika Ausburn, CMA to left deltoid, patient voiced no concerns nor showed any signs of distress during injection.  

## 2020-12-09 ENCOUNTER — Ambulatory Visit (INDEPENDENT_AMBULATORY_CARE_PROVIDER_SITE_OTHER): Payer: Medicare HMO | Admitting: Family Medicine

## 2020-12-09 ENCOUNTER — Other Ambulatory Visit: Payer: Self-pay

## 2020-12-09 ENCOUNTER — Encounter: Payer: Self-pay | Admitting: Family Medicine

## 2020-12-09 DIAGNOSIS — R21 Rash and other nonspecific skin eruption: Secondary | ICD-10-CM

## 2020-12-09 DIAGNOSIS — R42 Dizziness and giddiness: Secondary | ICD-10-CM | POA: Diagnosis not present

## 2020-12-09 MED ORDER — TRIAMCINOLONE ACETONIDE 0.1 % EX CREA: 1.0000 "application " | TOPICAL_CREAM | Freq: Two times a day (BID) | CUTANEOUS | 1 refills | Status: DC | PRN

## 2020-12-09 NOTE — Patient Instructions (Signed)
Let me get the echo results and we'll go from there.  Use triamcinolone on the bug bites.  Take care.  Glad to see you.

## 2020-12-09 NOTE — Progress Notes (Signed)
This visit occurred during the SARS-CoV-2 public health emergency.  Safety protocols were in place, including screening questions prior to the visit, additional usage of staff PPE, and extensive cleaning of exam room while observing appropriate contact time as indicated for disinfecting solutions.  Bite after bending over or squatting, then getting up, he'll get lightheaded.  Brief.  No syncope.  No room spinning.  This isn't a new issue for patient.  Not taking any BP meds.  Can happen getting out of, but not into, the bed.    We talked about CT results.  Echo pending regarding possible pulmonary hypertension.  We talked about pulmonary hypertension versus right-sided heart failure versus left-sided heart failure.  He has some incidental insect bites noted on his right shin.  Meds, vitals, and allergies reviewed.   ROS: Per HPI unless specifically indicated in ROS section   GEN: nad, alert and oriented HEENT: ncat NECK: supple w/o LA CV: rrr. PULM: ctab, no inc wob ABD: soft, +bs EXT: 1+ BLE edema.  SKIN: no acute rash other than a small amount of erythema on the right shin attributed to previous insect bite x2.  30 minutes were devoted to patient care in this encounter (this includes time spent reviewing the patient's file/history, interviewing and examining the patient, counseling/reviewing plan with patient).

## 2020-12-12 DIAGNOSIS — R42 Dizziness and giddiness: Secondary | ICD-10-CM | POA: Insufficient documentation

## 2020-12-12 DIAGNOSIS — R21 Rash and other nonspecific skin eruption: Secondary | ICD-10-CM | POA: Insufficient documentation

## 2020-12-12 NOTE — Assessment & Plan Note (Signed)
Does not appear infected, can use triamcinolone as needed topically.  He will update me as needed.

## 2020-12-12 NOTE — Assessment & Plan Note (Signed)
Not vertigo symptoms.  Routine cautions given to patient, especially regarding getting up too quickly.  Discussed right-sided versus left-sided cardiac function and anatomy, especially pulmonary arterial pressure.  We will get echo and go from there.  He agrees with plan.  Okay for outpatient follow-up.  Encouraged to drink enough fluid to stay well-hydrated.

## 2020-12-28 DIAGNOSIS — I10 Essential (primary) hypertension: Secondary | ICD-10-CM | POA: Diagnosis not present

## 2020-12-28 DIAGNOSIS — G6181 Chronic inflammatory demyelinating polyneuritis: Secondary | ICD-10-CM | POA: Diagnosis not present

## 2021-01-03 DIAGNOSIS — J449 Chronic obstructive pulmonary disease, unspecified: Secondary | ICD-10-CM | POA: Diagnosis not present

## 2021-01-05 ENCOUNTER — Ambulatory Visit: Payer: Medicare HMO

## 2021-01-11 ENCOUNTER — Ambulatory Visit (INDEPENDENT_AMBULATORY_CARE_PROVIDER_SITE_OTHER): Payer: Medicare HMO

## 2021-01-11 ENCOUNTER — Other Ambulatory Visit: Payer: Self-pay

## 2021-01-11 DIAGNOSIS — E538 Deficiency of other specified B group vitamins: Secondary | ICD-10-CM

## 2021-01-11 MED ORDER — CYANOCOBALAMIN 1000 MCG/ML IJ SOLN
1000.0000 ug | Freq: Once | INTRAMUSCULAR | Status: AC
  Administered 2021-01-11: 1000 ug via INTRAMUSCULAR

## 2021-01-11 NOTE — Progress Notes (Signed)
Patient presented for B 12 injection given by Leydy Worthey, CMA to right deltoid, patient voiced no concerns nor showed any signs of distress during injection.  

## 2021-01-19 ENCOUNTER — Other Ambulatory Visit: Payer: Self-pay

## 2021-01-19 ENCOUNTER — Ambulatory Visit (INDEPENDENT_AMBULATORY_CARE_PROVIDER_SITE_OTHER): Payer: Medicare HMO

## 2021-01-19 DIAGNOSIS — R9389 Abnormal findings on diagnostic imaging of other specified body structures: Secondary | ICD-10-CM

## 2021-01-19 DIAGNOSIS — Q2579 Other congenital malformations of pulmonary artery: Secondary | ICD-10-CM | POA: Diagnosis not present

## 2021-01-19 LAB — ECHOCARDIOGRAM COMPLETE
AR max vel: 2.86 cm2
AV Area VTI: 2.94 cm2
AV Area mean vel: 2.8 cm2
AV Mean grad: 7 mmHg
AV Peak grad: 12.5 mmHg
Ao pk vel: 1.77 m/s
Area-P 1/2: 3.39 cm2
Calc EF: 60.6 %
S' Lateral: 2.7 cm
Single Plane A2C EF: 57.7 %
Single Plane A4C EF: 60.3 %

## 2021-01-26 ENCOUNTER — Other Ambulatory Visit: Payer: Self-pay | Admitting: Family Medicine

## 2021-01-26 DIAGNOSIS — Q2579 Other congenital malformations of pulmonary artery: Secondary | ICD-10-CM

## 2021-01-30 DIAGNOSIS — R0602 Shortness of breath: Secondary | ICD-10-CM | POA: Insufficient documentation

## 2021-01-30 DIAGNOSIS — I700 Atherosclerosis of aorta: Secondary | ICD-10-CM | POA: Insufficient documentation

## 2021-01-30 NOTE — Progress Notes (Signed)
Cardiology Office Note  Date:  01/31/2021   ID:  Ricardo Wilson., DOB 08-20-1940, MRN [id number]  PCP:  Tonia Ghent, MD   Chief Complaint  Patient presents with   New Patient (Initial Visit)    Referred by PCP for Pulmonary artery abnormality. Meds reviewed verbally with patient.    HPI:  Mr Ricardo Wilson is a 80 y.o. male patient with PMH of  COPD. The patient has had a dx of COPD for many years.  1993 pneumonia with lung abscess (or infected cyst in the left lung).  bullous emphysema. left pneumothorax in 1998.  S/p lung reduction surgery at [facility] then.  progressive DOE since then. ED in 07/2020 for SOB.  Covid on 07/19/2020.  Who presents by referral from PMD for consutation of his SOB, possible pulmonary HTN on CT scan.  Quit smoking 1986. Former smoker for many years.. Worked, landscape. Very active, mild chronic SOB "Pushes himself" Currently rebuilding a deck, Cutting wood Does not use inhaler frequently  Echo 01/19/2021 results reviewed  1. Left ventricular ejection fraction, by estimation, is 60 to 65%. The  left ventricle has normal function. The left ventricle has no regional  wall motion abnormalities. There is mild asymmetric left ventricular  hypertrophy. Left ventricular diastolic  parameters are indeterminate.   2. Right ventricular systolic function is normal. The right ventricular  size is normal. Tricuspid regurgitation signal is inadequate for assessing  PA pressure.   3. The mitral valve is normal in structure. Trivial mitral valve  regurgitation.   4. The aortic valve was not well visualized. Aortic valve regurgitation  is not visualized.   5. The inferior vena cava is normal in size with greater than 50%  respiratory variability, suggesting right atrial pressure of 3 mmHg.   CT chest 12/01/2020 Severe Emphysema (ICD10-J43.9) with bilateral postoperative scarring. 3.  Aortic atherosclerosis (ICD10-I70.0). 4. Enlarged pulmonary arteries,  indicative of pulmonary arterial hypertension.  EKG personally reviewed by myself on todays visit NSR rate 86 bpm, no ST or T wave changes   PMH:   has a past medical history of Arthritis, COPD (chronic obstructive pulmonary disease) (HCC), and GERD (gastroesophageal reflux disease).  PSH:    Past Surgical History:  Procedure Laterality Date   APPENDECTOMY     CARPAL TUNNEL RELEASE Right    COLONOSCOPY  2013   tubular adenoma x2   INGUINAL HERNIA REPAIR Left 01/23/2016   Procedure: LEFT HERNIA REPAIR INGUINAL INCARCERATED;  Surgeon: Donnie Mesa, MD;  Location: [facility];  Service: General;  Laterality: Left;   lung resection     ROTATOR CUFF REPAIR Bilateral     Current Outpatient Medications  Medication Sig Dispense Refill   albuterol (VENTOLIN HFA) 108 (90 Base) MCG/ACT inhaler Inhale 1-2 puffs into the lungs every 6 (six) hours as needed for wheezing or shortness of breath. Ok to fill either proair HFA or ventolin 18 g 2   celecoxib (CELEBREX) 200 MG capsule 1 tab daily by mouth as needed. 90 capsule 3   cyanocobalamin (,VITAMIN B-12,) 1000 MCG/ML injection 1000 mcg injection IM monthly     pantoprazole (PROTONIX) 20 MG tablet Take 2 tablets (40 mg total) by mouth daily. 60 tablet 12   STIOLTO RESPIMAT 2.5-2.5 MCG/ACT AERS      triamcinolone cream (KENALOG) 0.1 % Apply 1 application topically 2 (two) times daily as needed. 30 g 1   No current facility-administered medications for this visit.     Allergies:   Oxycodone-acetaminophen and  Aspirin   Social History:  The patient  reports that he has quit smoking. He has never used smokeless tobacco. He reports current alcohol use. He reports that he does not use drugs.   Family History:   family history includes Alcohol abuse in his father; Diabetes in his father; Uterine cancer in his mother.    Review of Systems: Review of Systems  Constitutional: Negative.   HENT: Negative.    Respiratory:  Positive for shortness of  breath.   Cardiovascular: Negative.   Gastrointestinal: Negative.   Musculoskeletal: Negative.   Neurological: Negative.   Psychiatric/Behavioral: Negative.    All other systems reviewed and are negative.   PHYSICAL EXAM: VS:  BP 126/78 (BP Location: Left Arm, Patient Position: Sitting, Cuff Size: Normal)   Pulse 86   Ht '5\' 8"'$  (1.727 m)   Wt 174 lb (78.9 kg)   SpO2 98%   BMI 26.46 kg/m  , BMI Body mass index is 26.46 kg/m. GEN: Well nourished, well developed, in no acute distress HEENT: normal Neck: no JVD, carotid bruits, or masses Cardiac: RRR; no murmurs, rubs, or gallops,no edema  Respiratory:  clear to auscultation bilaterally, normal work of breathing GI: soft, nontender, nondistended, + BS MS: no deformity or atrophy Skin: warm and dry, no rash Neuro:  Strength and sensation are intact Psych: euthymic mood, full affect  Recent Labs: 05/24/2020: ALT 23; BUN 18; Creatinine, Ser 1.09; Potassium 4.1; Sodium 141; TSH 5.68    Lipid Panel Lab Results  Component Value Date   CHOL 133 05/24/2020   HDL 42.50 05/24/2020   LDLCALC 80 05/24/2020   TRIG 55.0 05/24/2020      Wt Readings from Last 3 Encounters:  01/31/21 174 lb (78.9 kg)  12/09/20 177 lb (80.3 kg)  08/09/20 180 lb (81.6 kg)       ASSESSMENT AND PLAN:  Problem List Items Addressed This Visit       Cardiology Problems   Aortic atherosclerosis (HCC)     Other   SOB (shortness of breath)   COPD exacerbation (HCC) - Primary   Aortic athero Mild aorta plaque on CT scan, images reviewed Cholesterol reasonable, quit smoking  COPD Resection, bullous severe emphysema on CT Mild sx, very active, no flares of his COPD Rarely uses inhalers  Dilated pulmonary artery on CT scan images reviewed Minimal sx Echocardiogram with no RV dilatation, no elevated right heart pressures, IVC does not appear dilated Nighttime symptoms no further work-up at this time Recommend he call us if he gets abdominal  distention, leg swelling, worsening shortness of breath unrelated to his lung disease Situation could perform right heart catheterization     Total encounter time more than 60 minutes  Greater than 50% was spent in counseling and coordination of care with the patient    Signed, Esmond Plants, M.D., Ph.D. [place], [place]

## 2021-01-31 ENCOUNTER — Other Ambulatory Visit: Payer: Self-pay

## 2021-01-31 ENCOUNTER — Ambulatory Visit: Payer: Medicare HMO | Admitting: Cardiovascular Disease

## 2021-01-31 VITALS — BP 126/78 | HR 86 | Ht 68.0 in | Wt 174.0 lb

## 2021-01-31 DIAGNOSIS — I700 Atherosclerosis of aorta: Secondary | ICD-10-CM | POA: Diagnosis not present

## 2021-01-31 DIAGNOSIS — J441 Chronic obstructive pulmonary disease with (acute) exacerbation: Secondary | ICD-10-CM

## 2021-01-31 DIAGNOSIS — R0602 Shortness of breath: Secondary | ICD-10-CM | POA: Diagnosis not present

## 2021-01-31 NOTE — Patient Instructions (Signed)
Medication Instructions:  No changes  If you need a refill on your cardiac medications before your next appointment, please call your pharmacy.    Lab work: No new labs needed   If you have labs (blood work) drawn today and your tests are completely normal, you will receive your results only by: [place] (if you have MyChart) OR A paper copy in the mail If you have any lab test that is abnormal or we need to change your treatment, we will call you to review the results.   Testing/Procedures: No new testing needed   Follow-Up: At [facility], you and your health needs are our priority.  As part of our continuing mission to provide you with exceptional heart care, we have created designated Provider Care Teams.  These Care Teams include your primary Cardiologist (physician) and Advanced Practice Providers (APPs -  Physician Assistants and Nurse Practitioners) who all work together to provide you with the care you need, when you need it.  You will need a follow up appointment as needed  Providers on your designated Care Team:   Murray Hodgkins, NP Christell Faith, PA-C Marrianne Mood, PA-C Cadence Kathlen Mody, [place]  Any Other Special Instructions Will Be Listed Below (If Applicable).  COVID-19 Vaccine Information can be found at: ShippingScam.co.uk For questions related to vaccine distribution or appointments, please email [email or web address] or call [phone].

## 2021-02-15 ENCOUNTER — Ambulatory Visit (INDEPENDENT_AMBULATORY_CARE_PROVIDER_SITE_OTHER): Payer: Medicare HMO

## 2021-02-15 ENCOUNTER — Other Ambulatory Visit: Payer: Self-pay

## 2021-02-15 DIAGNOSIS — E538 Deficiency of other specified B group vitamins: Secondary | ICD-10-CM

## 2021-02-15 MED ORDER — CYANOCOBALAMIN 1000 MCG/ML IJ SOLN
1000.0000 ug | Freq: Once | INTRAMUSCULAR | Status: AC
  Administered 2021-02-15: 1000 ug via INTRAMUSCULAR

## 2021-02-15 NOTE — Progress Notes (Signed)
Per orders of Dr. Duncan, injection of vit B12 given by Mecca Guitron. Patient tolerated injection well.  

## 2021-02-21 DIAGNOSIS — H43812 Vitreous degeneration, left eye: Secondary | ICD-10-CM | POA: Diagnosis not present

## 2021-03-11 DIAGNOSIS — H179 Unspecified corneal scar and opacity: Secondary | ICD-10-CM | POA: Diagnosis not present

## 2021-03-11 DIAGNOSIS — H43812 Vitreous degeneration, left eye: Secondary | ICD-10-CM | POA: Diagnosis not present

## 2021-03-24 ENCOUNTER — Ambulatory Visit (INDEPENDENT_AMBULATORY_CARE_PROVIDER_SITE_OTHER): Payer: Medicare HMO

## 2021-03-24 ENCOUNTER — Ambulatory Visit: Payer: Medicare HMO

## 2021-03-24 ENCOUNTER — Other Ambulatory Visit: Payer: Self-pay

## 2021-03-24 ENCOUNTER — Ambulatory Visit: Payer: Medicare HMO | Admitting: Family Medicine

## 2021-03-24 DIAGNOSIS — E538 Deficiency of other specified B group vitamins: Secondary | ICD-10-CM

## 2021-03-24 MED ORDER — CYANOCOBALAMIN 1000 MCG/ML IJ SOLN
1000.0000 ug | Freq: Once | INTRAMUSCULAR | Status: AC
  Administered 2021-03-24: 1000 ug via INTRAMUSCULAR

## 2021-03-24 NOTE — Progress Notes (Signed)
Per orders of Dr. [name], injection of monthly B12 1000 mcg/ml given by Pilar Grammes, CMA in Right Deltoid Patient tolerated injection well.   Discussed with pt that it looks like he is due for AWV in November. He stated he is fine with going to [facility] to see [place] [name]. Please send note to scheduler to have this done. Thanks!

## 2021-03-25 ENCOUNTER — Telehealth: Payer: Self-pay | Admitting: Family Medicine

## 2021-03-25 NOTE — Telephone Encounter (Signed)
Called patient no answer.

## 2021-03-25 NOTE — Telephone Encounter (Signed)
-----   Message from Tonia Ghent, MD sent at 03/24/2021 11:41 AM EDT ----- See below, please see about getting scheduled.  Thanks.   Brigitte Pulse  -====================  Discussed with pt that it looks like he is due for AWV in November. He stated he is fine with going to [facility] to see Dr [name]. Please send note to scheduler to have this done. Thanks!

## 2021-04-28 ENCOUNTER — Other Ambulatory Visit: Payer: Self-pay

## 2021-04-28 ENCOUNTER — Ambulatory Visit (INDEPENDENT_AMBULATORY_CARE_PROVIDER_SITE_OTHER): Payer: Medicare HMO

## 2021-04-28 DIAGNOSIS — E538 Deficiency of other specified B group vitamins: Secondary | ICD-10-CM

## 2021-04-28 MED ORDER — CYANOCOBALAMIN 1000 MCG/ML IJ SOLN
1000.0000 ug | Freq: Once | INTRAMUSCULAR | Status: AC
  Administered 2021-04-28: 1000 ug via INTRAMUSCULAR

## 2021-04-28 NOTE — Progress Notes (Signed)
Per orders of Dr. Duncan, injection of monthly B12 1000 mcg/ml given by Mirenda Baltazar P Ori Kreiter, CMA in Left Deltoid. Patient tolerated injection well.  

## 2021-05-17 ENCOUNTER — Encounter: Payer: Self-pay | Admitting: Family Medicine

## 2021-05-17 ENCOUNTER — Other Ambulatory Visit: Payer: Self-pay

## 2021-05-17 ENCOUNTER — Ambulatory Visit (INDEPENDENT_AMBULATORY_CARE_PROVIDER_SITE_OTHER): Payer: Medicare HMO | Admitting: Family Medicine

## 2021-05-17 VITALS — BP 124/76 | HR 72 | Temp 97.4°F | Ht 68.0 in | Wt 172.0 lb

## 2021-05-17 DIAGNOSIS — J449 Chronic obstructive pulmonary disease, unspecified: Secondary | ICD-10-CM

## 2021-05-17 DIAGNOSIS — R7989 Other specified abnormal findings of blood chemistry: Secondary | ICD-10-CM | POA: Diagnosis not present

## 2021-05-17 DIAGNOSIS — E538 Deficiency of other specified B group vitamins: Secondary | ICD-10-CM | POA: Diagnosis not present

## 2021-05-17 DIAGNOSIS — E786 Lipoprotein deficiency: Secondary | ICD-10-CM

## 2021-05-17 DIAGNOSIS — M1990 Unspecified osteoarthritis, unspecified site: Secondary | ICD-10-CM

## 2021-05-17 DIAGNOSIS — R739 Hyperglycemia, unspecified: Secondary | ICD-10-CM

## 2021-05-17 DIAGNOSIS — Z0000 Encounter for general adult medical examination without abnormal findings: Secondary | ICD-10-CM | POA: Diagnosis not present

## 2021-05-17 DIAGNOSIS — Z23 Encounter for immunization: Secondary | ICD-10-CM | POA: Diagnosis not present

## 2021-05-17 DIAGNOSIS — K219 Gastro-esophageal reflux disease without esophagitis: Secondary | ICD-10-CM | POA: Diagnosis not present

## 2021-05-17 DIAGNOSIS — Z7189 Other specified counseling: Secondary | ICD-10-CM

## 2021-05-17 LAB — COMPREHENSIVE METABOLIC PANEL
ALT: 17 U/L (ref 0–53)
AST: 16 U/L (ref 0–37)
Albumin: 4.1 g/dL (ref 3.5–5.2)
Alkaline Phosphatase: 54 U/L (ref 39–117)
BUN: 22 mg/dL (ref 6–23)
CO2: 28 mEq/L (ref 19–32)
Calcium: 9.4 mg/dL (ref 8.4–10.5)
Chloride: 106 mEq/L (ref 96–112)
Creatinine, Ser: 0.99 mg/dL (ref 0.40–1.50)
GFR: 72.5 mL/min (ref >60.00)
Glucose, Bld: 105 mg/dL — ABNORMAL HIGH (ref 70–99)
Potassium: 4.6 mEq/L (ref 3.5–5.1)
Sodium: 139 mEq/L (ref 135–145)
Total Bilirubin: 0.7 mg/dL (ref 0.2–1.2)
Total Protein: 6.5 g/dL (ref 6.0–8.3)

## 2021-05-17 LAB — LIPID PANEL
Cholesterol: 140 mg/dL (ref 0–200)
HDL: 40.6 mg/dL (ref >39.00)
LDL Cholesterol: 83 mg/dL (ref 0–99)
NonHDL: 99.4
Total CHOL/HDL Ratio: 3
Triglycerides: 82 mg/dL (ref 0.0–149.0)
VLDL: 16.4 mg/dL (ref 0.0–40.0)

## 2021-05-17 LAB — TSH: TSH: 4.09 u[IU]/mL (ref 0.35–5.50)

## 2021-05-17 LAB — HEMOGLOBIN A1C: Hgb A1c MFr Bld: 5.4 % (ref 4.6–6.5)

## 2021-05-17 LAB — VITAMIN B12: Vitamin B-12: 418 pg/mL (ref 211–911)

## 2021-05-17 MED ORDER — PANTOPRAZOLE SODIUM 20 MG PO TBEC: 40.0000 mg | DELAYED_RELEASE_TABLET | Freq: Every day | ORAL | 12 refills | Status: DC

## 2021-05-17 MED ORDER — ALBUTEROL SULFATE HFA 108 (90 BASE) MCG/ACT IN AERS: 1.0000 | INHALATION_SPRAY | Freq: Four times a day (QID) | RESPIRATORY_TRACT | 2 refills | Status: DC | PRN

## 2021-05-17 MED ORDER — CELECOXIB 200 MG PO CAPS: ORAL_CAPSULE | ORAL | 3 refills | Status: DC

## 2021-05-17 NOTE — Patient Instructions (Signed)
Flu shot today.  Go to the lab on the way out.   If you have mychart we'll likely use that to update you.    Take care.  Glad to see you. Don't change your meds for now.

## 2021-05-17 NOTE — Progress Notes (Signed)
This visit occurred during the SARS-CoV-2 public health emergency.  Safety protocols were in place, including screening questions prior to the visit, additional usage of staff PPE, and extensive cleaning of exam room while observing appropriate contact time as indicated for disinfecting solutions.  I have personally reviewed the Medicare Annual Wellness questionnaire and have noted 1. The patient's medical and social history 2. Their use of alcohol, tobacco or illicit drugs 3. Their current medications and supplements 4. The patient's functional ability including ADL's, fall risks, home safety risks and hearing or visual             impairment. 5. Diet and physical activities 6. Evidence for depression or mood disorders  The patients weight, height, BMI have been recorded in the chart and visual acuity is per eye clinic.  I have made referrals, counseling and provided education to the patient based review of the above and I have provided the pt with a written personalized care plan for preventive services.  Provider list updated- see scanned forms.  Routine anticipatory guidance given to patient.  See health maintenance. The possibility exists that previously documented standard health maintenance information may have been brought forward from a previous encounter into this note.  If needed, that same information has been updated to reflect the current situation based on today's encounter.    Flu up-to-date Shingles up-to-date PNA up-to-date Tetanus 2015 COVID-vaccine previously done Colonoscopy 2019 Prostate cancer screening deferred given his age. Advance directive-wife first then if needed sons equally designated if patient were incapacitated. Cognitive function addressed- see scanned forms- and if abnormal then additional documentation follows.   In addition to [facility] Wellness, follow up visit for the below conditions:  GERD.  Still with some reflux but with some relief from PPI.   No ADE on med.  Compliant.  Arthritis.  Taking celebrex prn.  He has hand stiffness at baseline.  He is putting up with it.    COPD.  On trelegy at baseline.  Taking SABA rarely.  No cough.  No SOB except when squatting and bending over or if hauling a sig load up a hill.  No wheeze.  No sputum.    B12 def.  Still on monthly IM injection.  Compliant.  Labs pending.    History of abnormal TSH and hyperglycemia.  Labs pending regarding both.  PMH and SH reviewed  Meds, vitals, and allergies reviewed.   ROS: Per HPI.  Unless specifically indicated otherwise in HPI, the patient denies:  General: fever. Eyes: acute vision changes ENT: sore throat Cardiovascular: chest pain Respiratory: SOB GI: vomiting GU: dysuria Musculoskeletal: acute back pain Derm: acute rash Neuro: acute motor dysfunction Psych: worsening mood Endocrine: polydipsia Heme: bleeding Allergy: hayfever  GEN: nad, alert and oriented HEENT: ncat NECK: supple w/o LA, no thyromegaly. CV: rrr. PULM: ctab, no inc wob ABD: soft, +bs EXT: no edema SKIN: Well-perfused.

## 2021-05-18 NOTE — Assessment & Plan Note (Signed)
See notes on labs. 

## 2021-05-18 NOTE — Assessment & Plan Note (Signed)
Continue monthly IM injection.  See notes on labs.

## 2021-05-18 NOTE — Assessment & Plan Note (Signed)
Flu up-to-date Shingles up-to-date PNA up-to-date Tetanus 2015 COVID-vaccine previously done Colonoscopy 2019 Prostate cancer screening deferred given his age. Advance directive-wife first then if needed sons equally designated if patient were incapacitated. Cognitive function addressed- see scanned forms- and if abnormal then additional documentation follows.

## 2021-05-18 NOTE — Assessment & Plan Note (Signed)
Continue Trelegy at baseline.  Routine cautions given to patient.  Discussed routine maintenance medication versus symptomatic treatment with albuterol.  Lungs are clear.  Okay for outpatient follow-up.

## 2021-05-18 NOTE — Assessment & Plan Note (Signed)
Would continue Protonix as is.  Update me as needed.

## 2021-05-18 NOTE — Assessment & Plan Note (Signed)
Advance directive-wife first then if needed sons equally designated if patient were incapacitated. 

## 2021-05-18 NOTE — Assessment & Plan Note (Signed)
Continue Celebrex as needed.  He is putting it with hand stiffness.  He has tolerated Celebrex so far.

## 2021-05-25 ENCOUNTER — Telehealth: Payer: Self-pay | Admitting: *Deleted

## 2021-05-25 DIAGNOSIS — J101 Influenza due to other identified influenza virus with other respiratory manifestations: Secondary | ICD-10-CM | POA: Diagnosis not present

## 2021-05-25 DIAGNOSIS — R9431 Abnormal electrocardiogram [ECG] [EKG]: Secondary | ICD-10-CM | POA: Diagnosis not present

## 2021-05-25 DIAGNOSIS — J09X2 Influenza due to identified novel influenza A virus with other respiratory manifestations: Secondary | ICD-10-CM | POA: Diagnosis not present

## 2021-05-25 DIAGNOSIS — R0602 Shortness of breath: Secondary | ICD-10-CM | POA: Diagnosis not present

## 2021-05-25 DIAGNOSIS — R059 Cough, unspecified: Secondary | ICD-10-CM | POA: Diagnosis not present

## 2021-05-25 DIAGNOSIS — R051 Acute cough: Secondary | ICD-10-CM | POA: Diagnosis not present

## 2021-05-25 DIAGNOSIS — R0989 Other specified symptoms and signs involving the circulatory and respiratory systems: Secondary | ICD-10-CM | POA: Diagnosis not present

## 2021-05-25 DIAGNOSIS — R000 Tachycardia, unspecified: Secondary | ICD-10-CM | POA: Diagnosis not present

## 2021-05-25 DIAGNOSIS — Z902 Acquired absence of lung [part of]: Secondary | ICD-10-CM | POA: Diagnosis not present

## 2021-05-25 DIAGNOSIS — I517 Cardiomegaly: Secondary | ICD-10-CM | POA: Diagnosis not present

## 2021-05-25 DIAGNOSIS — R531 Weakness: Secondary | ICD-10-CM | POA: Diagnosis not present

## 2021-05-25 DIAGNOSIS — R509 Fever, unspecified: Secondary | ICD-10-CM | POA: Diagnosis not present

## 2021-05-25 DIAGNOSIS — Z87891 Personal history of nicotine dependence: Secondary | ICD-10-CM | POA: Diagnosis not present

## 2021-05-25 DIAGNOSIS — J449 Chronic obstructive pulmonary disease, unspecified: Secondary | ICD-10-CM | POA: Diagnosis not present

## 2021-05-25 NOTE — Telephone Encounter (Signed)
PLEASE NOTE: All timestamps contained within this report are represented as [place] Standard Time. CONFIDENTIALTY NOTICE: This fax transmission is intended only for the addressee. It contains information that is legally privileged, confidential or otherwise protected from use or disclosure. If you are not the intended recipient, you are strictly prohibited from reviewing, disclosing, copying using or disseminating any of this information or taking any action in reliance on or regarding this information. If you have received this fax in error, please notify us immediately by telephone so that we can arrange for its return to [place]. Phone: [phone], Toll-Free: [phone], Fax: [phone] Page: 1 of 2 Call Id: [id number] [place] [facility] - Client TELEPHONE ADVICE RECORD AccessNurse Patient Name: Ricardo Wilson Gender: Male DOB: 1940/11/03 Age: 80 Y 26 M 8 D Return Phone Number: [phone] (Primary), [phone] (Secondary) Address: City/ State/ Zip: [place]  [address] Client [place] [facility] - Client Client Site [place] Physician Renford Dills - MD Contact Type Call Who Is Calling Patient / Member / Family / Caregiver Call Type Triage / Clinical Caller Name Remer Couse Relationship To Patient Spouse Return Phone Number [phone] (Primary) Chief Complaint BREATHING - shortness of breath or sounds breathless Reason for Call Symptomatic / Request for [place] states her husband is having shortness of breath. He has a fever of 101. Taken his inhalers but is still short of breath. [place] Not Listed [facility] Translation No Nurse Assessment Nurse: Clovis Riley, RN, Georgina Peer Date/Time (Eastern Time): 05/25/2021 10:39:26 AM Confirm and document reason for call. If symptomatic, describe symptoms. ---Caller states her husband is having shortness  of breath and has COPD. He has a fever of 101. He has Taken his inhalers(albuterol and treligy) but is still short of breath. He is coughing up yellow/green sputum. States he started getting sick on thursday. Does the patient have any new or worsening symptoms? ---Yes Will a triage be completed? ---Yes Related visit to physician within the last 2 weeks? ---No Does the PT have any chronic conditions? (i.e. diabetes, asthma, this includes High risk factors for pregnancy, etc.) ---Yes List chronic conditions. ---copd, Is this a behavioral health or substance abuse call? ---No Guidelines Guideline Title Affirmed Question Affirmed Notes Nurse Date/Time (Eastern Time) COPD Oxygen Monitoring and Hypoxia Fever > 100.4 F (38.0 C) [place], RN, Georgina Peer 05/25/2021 10:42:12 AM PLEASE NOTE: All timestamps contained within this report are represented as [place] Standard Time. CONFIDENTIALTY NOTICE: This fax transmission is intended only for the addressee. It contains information that is legally privileged, confidential or otherwise protected from use or disclosure. If you are not the intended recipient, you are strictly prohibited from reviewing, disclosing, copying using or disseminating any of this information or taking any action in reliance on or regarding this information. If you have received this fax in error, please notify us immediately by telephone so that we can arrange for its return to [place]. Phone: [phone], Toll-Free: [phone], Fax: [phone] Page: 2 of 2 Call Id: [id number] [place]. Time Eilene Ghazi Time) Disposition Final User 05/25/2021 10:38:06 AM Send to Urgent [facility] [place] 05/25/2021 10:45:57 AM See HCP within 4 Hours (or PCP triage) Yes Clovis Riley, RN, Leilani Merl Disagree/Comply Comply Caller Understands Yes PreDisposition Call Doctor Care Advice Given Per Guideline SEE HCP (OR PCP TRIAGE) WITHIN 4 HOURS: * IF OFFICE WILL BE OPEN: You need to be seen within the  next 3 or 4 hours. Call your  doctor (or NP/PA) now or as soon as the office opens. CARE ADVICE given per COPD Oxygen Monitoring and Hypoxia (Adult) guideline. Referrals GO TO FACILITY OTHER - SPECIFY

## 2021-05-25 NOTE — Telephone Encounter (Signed)
Please try to contact patient again and advise him to seek care.  Thanks.

## 2021-05-25 NOTE — Telephone Encounter (Signed)
Noted.  Please try to check on patient on Thursday.  Thanks.

## 2021-05-25 NOTE — Telephone Encounter (Signed)
Tried to reach patient at both numbers on chart and got voicemail and answering machine. Left a message for patient or his wife to call the office back.

## 2021-05-25 NOTE — Telephone Encounter (Signed)
Still unable to reach pt but left detailed v/m of what Dr [name] said in phone note. Sending note to Sherrilee Gilles CMA. Do not see any place in care everywhere that pt has been seen.

## 2021-05-25 NOTE — Telephone Encounter (Signed)
Left another message on voicemail to call the office back. Will route to PCP so that he will be aware of patient's phone call with access nurse.

## 2021-05-26 NOTE — Telephone Encounter (Signed)
Pt Wife Levada Dy called in requesting to speak to a manager because she is upset that pt was unable to be seen by a provider .  She was told that  there 's no available appointment

## 2021-05-27 NOTE — Telephone Encounter (Signed)
Spoke with patient's wife.  She said that when she spoke with our office initially she was told that her husband could not be worked in.  She was transferred to triage nurse and since they told her that he would have to be seen in 4 hours, they went on to the ER.  She was very upset by this and that he had to be there for 10 hours.  She knows that Dr. [name] would have worked him in if we had just given him the message.  I apologized but did let her know that transferring him to the triage given his symptoms was proper procedure.  Also, that we did try to contact patient back within a few hours to discuss and be sure he was receiving care.  We are having issues with access and are working to expand our ability to see patients but in this case it may have been that the ER was the best place for him given his breathing severity.    Wife does express that patient was experiencing significant SOB and low oxygen saturation. She states that they did have a hard time getting his o2 up at the ER and that they had to give him medications to help.  I do understand that patients want to see their doctor when they are sick and reassured patient's wife that we are working hard to improve our availability either with the PCP or with another doctor in the primary care system to avoid unnecessary ER visits and I am sorry for her experience.  I will work with our front office staff to ensure that if we are full for the day that we are looking at other Providers in [place] for acute availability.  But, in this case, patient would and should have still gone through triage given the symptoms that he was having.   I told wife that if she ever has any further questions or concerns to please call and ask for me Leafy Ro).  She thanked me so much for the call.  She states patient is doing some better today and to thank [place] for checking in.   FYI to Dr. [name]

## 2021-05-28 NOTE — Telephone Encounter (Signed)
Noted. Thanks.

## 2021-05-31 ENCOUNTER — Other Ambulatory Visit: Payer: Self-pay

## 2021-05-31 ENCOUNTER — Ambulatory Visit (INDEPENDENT_AMBULATORY_CARE_PROVIDER_SITE_OTHER): Payer: Medicare HMO

## 2021-05-31 DIAGNOSIS — E538 Deficiency of other specified B group vitamins: Secondary | ICD-10-CM

## 2021-05-31 MED ORDER — CYANOCOBALAMIN 1000 MCG/ML IJ SOLN
1000.0000 ug | Freq: Once | INTRAMUSCULAR | Status: AC
  Administered 2021-05-31: 1000 ug via INTRAMUSCULAR

## 2021-05-31 NOTE — Progress Notes (Signed)
Per orders of Dr. Duncan, monthly injection of B12 given by Sollie Vultaggio G Aliyanah Rozas. Patient tolerated injection well.  

## 2021-06-24 DIAGNOSIS — H35363 Drusen (degenerative) of macula, bilateral: Secondary | ICD-10-CM | POA: Diagnosis not present

## 2021-06-24 DIAGNOSIS — H43812 Vitreous degeneration, left eye: Secondary | ICD-10-CM | POA: Diagnosis not present

## 2021-06-24 DIAGNOSIS — H179 Unspecified corneal scar and opacity: Secondary | ICD-10-CM | POA: Diagnosis not present

## 2021-06-27 ENCOUNTER — Other Ambulatory Visit: Payer: Self-pay | Admitting: Family Medicine

## 2021-06-27 DIAGNOSIS — J449 Chronic obstructive pulmonary disease, unspecified: Secondary | ICD-10-CM | POA: Diagnosis not present

## 2021-07-07 ENCOUNTER — Other Ambulatory Visit: Payer: Self-pay

## 2021-07-07 ENCOUNTER — Ambulatory Visit (INDEPENDENT_AMBULATORY_CARE_PROVIDER_SITE_OTHER): Payer: Medicare HMO

## 2021-07-07 DIAGNOSIS — E538 Deficiency of other specified B group vitamins: Secondary | ICD-10-CM

## 2021-07-07 MED ORDER — CYANOCOBALAMIN 1000 MCG/ML IJ SOLN
1000.0000 ug | Freq: Once | INTRAMUSCULAR | Status: AC
Start: 2021-07-07 — End: 2021-07-07
  Administered 2021-07-07: 09:00:00 1000 ug via INTRAMUSCULAR

## 2021-07-07 NOTE — Progress Notes (Signed)
Per orders of Dr. Duncan, monthly injection of B12 given by Kenadie Royce G Lamaria Hildebrandt. Patient tolerated injection well.  

## 2021-07-18 DIAGNOSIS — Z0100 Encounter for examination of eyes and vision without abnormal findings: Secondary | ICD-10-CM | POA: Diagnosis not present

## 2021-07-18 DIAGNOSIS — H524 Presbyopia: Secondary | ICD-10-CM | POA: Diagnosis not present

## 2021-08-09 ENCOUNTER — Ambulatory Visit: Payer: Medicare HMO

## 2021-08-11 ENCOUNTER — Other Ambulatory Visit: Payer: Self-pay

## 2021-08-11 ENCOUNTER — Ambulatory Visit (INDEPENDENT_AMBULATORY_CARE_PROVIDER_SITE_OTHER): Payer: Medicare HMO

## 2021-08-11 DIAGNOSIS — E538 Deficiency of other specified B group vitamins: Secondary | ICD-10-CM | POA: Diagnosis not present

## 2021-08-11 MED ORDER — CYANOCOBALAMIN 1000 MCG/ML IJ SOLN
1000.0000 ug | Freq: Once | INTRAMUSCULAR | Status: AC
  Administered 2021-08-11: 1000 ug via INTRAMUSCULAR

## 2021-08-11 NOTE — Progress Notes (Signed)
Per orders of Dr. Duncan, injection of monthly B12 1000 mcg/ml given by Marquarius Lofton P Jessice Madill, CMA in Left Deltoid. Patient tolerated injection well.  

## 2021-08-17 DIAGNOSIS — M79642 Pain in left hand: Secondary | ICD-10-CM | POA: Diagnosis not present

## 2021-08-17 DIAGNOSIS — M79641 Pain in right hand: Secondary | ICD-10-CM | POA: Diagnosis not present

## 2021-09-08 ENCOUNTER — Other Ambulatory Visit: Payer: Self-pay

## 2021-09-08 ENCOUNTER — Ambulatory Visit (INDEPENDENT_AMBULATORY_CARE_PROVIDER_SITE_OTHER): Payer: Medicare HMO

## 2021-09-08 DIAGNOSIS — E538 Deficiency of other specified B group vitamins: Secondary | ICD-10-CM

## 2021-09-08 MED ORDER — CYANOCOBALAMIN 1000 MCG/ML IJ SOLN
1000.0000 ug | Freq: Once | INTRAMUSCULAR | Status: AC
  Administered 2021-09-08: 1000 ug via INTRAMUSCULAR

## 2021-09-08 NOTE — Progress Notes (Signed)
Per orders of Dr. Duncan, injection of vit B12 given by Kameshia Madruga. Patient tolerated injection well.  

## 2021-09-26 DIAGNOSIS — J449 Chronic obstructive pulmonary disease, unspecified: Secondary | ICD-10-CM | POA: Diagnosis not present

## 2021-10-20 ENCOUNTER — Ambulatory Visit (INDEPENDENT_AMBULATORY_CARE_PROVIDER_SITE_OTHER): Payer: Medicare HMO

## 2021-10-20 DIAGNOSIS — E538 Deficiency of other specified B group vitamins: Secondary | ICD-10-CM | POA: Diagnosis not present

## 2021-10-20 MED ORDER — CYANOCOBALAMIN 1000 MCG/ML IJ SOLN
1000.0000 ug | Freq: Once | INTRAMUSCULAR | Status: AC
  Administered 2021-10-20: 1000 ug via INTRAMUSCULAR

## 2021-10-20 NOTE — Progress Notes (Signed)
Per orders of Dr. Tower, injection of vit B12 given by Eric Morganti. Patient tolerated injection well.  

## 2021-11-30 ENCOUNTER — Ambulatory Visit (INDEPENDENT_AMBULATORY_CARE_PROVIDER_SITE_OTHER): Payer: Medicare HMO

## 2021-11-30 DIAGNOSIS — E538 Deficiency of other specified B group vitamins: Secondary | ICD-10-CM

## 2021-11-30 MED ORDER — CYANOCOBALAMIN 1000 MCG/ML IJ SOLN
1000.0000 ug | Freq: Once | INTRAMUSCULAR | Status: AC
  Administered 2021-11-30: 1000 ug via INTRAMUSCULAR

## 2021-11-30 NOTE — Progress Notes (Signed)
Patient presented for B 12 injection given by Jazz Biddy, CMA to right deltoid, patient voiced no concerns nor showed any signs of distress during injection.  

## 2021-12-14 DIAGNOSIS — H04123 Dry eye syndrome of bilateral lacrimal glands: Secondary | ICD-10-CM | POA: Diagnosis not present

## 2021-12-27 DIAGNOSIS — J449 Chronic obstructive pulmonary disease, unspecified: Secondary | ICD-10-CM | POA: Diagnosis not present

## 2022-01-05 ENCOUNTER — Ambulatory Visit (INDEPENDENT_AMBULATORY_CARE_PROVIDER_SITE_OTHER): Payer: Medicare HMO

## 2022-01-05 DIAGNOSIS — E538 Deficiency of other specified B group vitamins: Secondary | ICD-10-CM

## 2022-01-05 MED ORDER — CYANOCOBALAMIN 1000 MCG/ML IJ SOLN
1000.0000 ug | Freq: Once | INTRAMUSCULAR | Status: AC
  Administered 2022-01-05: 1000 ug via INTRAMUSCULAR

## 2022-01-05 NOTE — Progress Notes (Signed)
Per orders of Dr. Duncan, injection of vit B12 given by Pamelia Botto. Patient tolerated injection well.  

## 2022-02-08 ENCOUNTER — Ambulatory Visit (INDEPENDENT_AMBULATORY_CARE_PROVIDER_SITE_OTHER): Payer: Medicare HMO | Admitting: *Deleted

## 2022-02-08 DIAGNOSIS — E538 Deficiency of other specified B group vitamins: Secondary | ICD-10-CM

## 2022-02-08 MED ORDER — CYANOCOBALAMIN 1000 MCG/ML IJ SOLN
1000.0000 ug | Freq: Once | INTRAMUSCULAR | Status: AC
  Administered 2022-02-08: 1000 ug via INTRAMUSCULAR

## 2022-02-08 NOTE — Progress Notes (Signed)
Per orders of Dr. Gutierrez, injection of Vitamin B-12 given by Annmarie Plemmons. Patient tolerated injection well. 

## 2022-03-15 ENCOUNTER — Ambulatory Visit (INDEPENDENT_AMBULATORY_CARE_PROVIDER_SITE_OTHER): Payer: Medicare HMO | Admitting: *Deleted

## 2022-03-15 DIAGNOSIS — E538 Deficiency of other specified B group vitamins: Secondary | ICD-10-CM

## 2022-03-15 MED ORDER — CYANOCOBALAMIN 1000 MCG/ML IJ SOLN
1000.0000 ug | Freq: Once | INTRAMUSCULAR | Status: AC
  Administered 2022-03-15: 1000 ug via INTRAMUSCULAR

## 2022-03-15 NOTE — Progress Notes (Signed)
Per orders of Dr. Gutierrez, injection of Vitamin B-12 given by Briellah Baik. Patient tolerated injection well. 

## 2022-04-07 ENCOUNTER — Telehealth: Payer: Self-pay | Admitting: Family Medicine

## 2022-04-07 DIAGNOSIS — S91311A Laceration without foreign body, right foot, initial encounter: Secondary | ICD-10-CM | POA: Diagnosis not present

## 2022-04-07 NOTE — Telephone Encounter (Signed)
Scheduled patient to come in on 04/11/22 for tetanus shot.

## 2022-04-07 NOTE — Telephone Encounter (Signed)
Patient called in stating that he was seen at urgent care for a cut on his foot and received stitches. He stated they mentioned  he may need a tetanus shot. Last tetanus shot was in 2015 but patient was just concerned. Please advise. Thank you.

## 2022-04-07 NOTE — Telephone Encounter (Signed)
Since greater than 5 years, then reasonable to get the tetanus shot done.  Thanks.

## 2022-04-11 ENCOUNTER — Ambulatory Visit (INDEPENDENT_AMBULATORY_CARE_PROVIDER_SITE_OTHER): Payer: Medicare HMO | Admitting: *Deleted

## 2022-04-11 DIAGNOSIS — Z23 Encounter for immunization: Secondary | ICD-10-CM

## 2022-04-18 ENCOUNTER — Ambulatory Visit (INDEPENDENT_AMBULATORY_CARE_PROVIDER_SITE_OTHER): Payer: Medicare HMO

## 2022-04-18 DIAGNOSIS — E538 Deficiency of other specified B group vitamins: Secondary | ICD-10-CM

## 2022-04-18 MED ORDER — CYANOCOBALAMIN 1000 MCG/ML IJ SOLN
1000.0000 ug | Freq: Once | INTRAMUSCULAR | Status: AC
  Administered 2022-04-18: 1000 ug via INTRAMUSCULAR

## 2022-04-18 NOTE — Progress Notes (Signed)
Per orders of Dr. Graham Duncan, injection of Vitamin B 12 given in right deltoid given by Nevayah Faust. Patient tolerated injection well.  

## 2022-04-19 ENCOUNTER — Encounter: Payer: Self-pay | Admitting: Family Medicine

## 2022-04-19 ENCOUNTER — Ambulatory Visit (INDEPENDENT_AMBULATORY_CARE_PROVIDER_SITE_OTHER): Payer: Medicare HMO | Admitting: Family Medicine

## 2022-04-19 VITALS — BP 112/64 | HR 73 | Temp 98.0°F | Ht 68.0 in | Wt 169.5 lb

## 2022-04-19 DIAGNOSIS — Z4802 Encounter for removal of sutures: Secondary | ICD-10-CM | POA: Diagnosis not present

## 2022-04-19 NOTE — Progress Notes (Signed)
    Minsa Weddington T. Asmaa Tirpak, MD, [place] at [facility] [place], [address]  Phone: [phone]  FAX: [phone]  Ricardo Wilson. - 81 y.o. male  MRN [id number]  Date of Birth: 1940/07/27  Date: 04/19/2022  PCP: Tonia Ghent, MD  Referral: Tonia Ghent, MD  Chief Complaint  Patient presents with   Suture / Staple Removal    Right Heel (9 sutures)   Subjective:   Ricardo Wilson. is a 81 y.o. very pleasant male patient with Body mass index is 25.77 kg/m. who presents with the following:  He presents for suture removal.  #9 on the R heel.  Were removed without difficulty from the right heel.  Wound is closed and appears healthy.  I placed 6 Steri-Strips.  He was instructed to just wash the wound with soap and water and to avoid alcohol and peroxide.  Electronically Signed  By: Owens Loffler, MD On: 04/19/2022 1:30 PM

## 2022-05-08 ENCOUNTER — Telehealth: Payer: Self-pay | Admitting: Family Medicine

## 2022-05-08 NOTE — Telephone Encounter (Signed)
LVM for pt to rtn my call to schedule AWV with [facility] call back # [phone] 

## 2022-05-09 ENCOUNTER — Other Ambulatory Visit: Payer: Self-pay | Admitting: Family Medicine

## 2022-05-09 DIAGNOSIS — E538 Deficiency of other specified B group vitamins: Secondary | ICD-10-CM

## 2022-05-09 DIAGNOSIS — R739 Hyperglycemia, unspecified: Secondary | ICD-10-CM

## 2022-05-09 DIAGNOSIS — E786 Lipoprotein deficiency: Secondary | ICD-10-CM

## 2022-05-09 DIAGNOSIS — R7989 Other specified abnormal findings of blood chemistry: Secondary | ICD-10-CM

## 2022-05-11 ENCOUNTER — Telehealth: Payer: Self-pay | Admitting: Family Medicine

## 2022-05-11 NOTE — Telephone Encounter (Signed)
LVM for pt to rtn my call to schedule AWV with [facility] call back # [phone] 

## 2022-05-12 ENCOUNTER — Other Ambulatory Visit (INDEPENDENT_AMBULATORY_CARE_PROVIDER_SITE_OTHER): Payer: Medicare HMO

## 2022-05-12 DIAGNOSIS — R7989 Other specified abnormal findings of blood chemistry: Secondary | ICD-10-CM | POA: Diagnosis not present

## 2022-05-12 DIAGNOSIS — R739 Hyperglycemia, unspecified: Secondary | ICD-10-CM

## 2022-05-12 DIAGNOSIS — E538 Deficiency of other specified B group vitamins: Secondary | ICD-10-CM

## 2022-05-12 DIAGNOSIS — E786 Lipoprotein deficiency: Secondary | ICD-10-CM | POA: Diagnosis not present

## 2022-05-12 LAB — LIPID PANEL
Cholesterol: 132 mg/dL (ref 0–200)
HDL: 39.7 mg/dL (ref >39.00)
LDL Cholesterol: 71 mg/dL (ref 0–99)
NonHDL: 92.18
Total CHOL/HDL Ratio: 3
Triglycerides: 105 mg/dL (ref 0.0–149.0)
VLDL: 21 mg/dL (ref 0.0–40.0)

## 2022-05-12 LAB — COMPREHENSIVE METABOLIC PANEL
ALT: 16 U/L (ref 0–53)
AST: 16 U/L (ref 0–37)
Albumin: 4.2 g/dL (ref 3.5–5.2)
Alkaline Phosphatase: 54 U/L (ref 39–117)
BUN: 20 mg/dL (ref 6–23)
CO2: 29 mEq/L (ref 19–32)
Calcium: 9.5 mg/dL (ref 8.4–10.5)
Chloride: 104 mEq/L (ref 96–112)
Creatinine, Ser: 1.13 mg/dL (ref 0.40–1.50)
GFR: 61.43 mL/min (ref >60.00)
Glucose, Bld: 89 mg/dL (ref 70–99)
Potassium: 4.3 mEq/L (ref 3.5–5.1)
Sodium: 139 mEq/L (ref 135–145)
Total Bilirubin: 0.7 mg/dL (ref 0.2–1.2)
Total Protein: 6.6 g/dL (ref 6.0–8.3)

## 2022-05-12 LAB — VITAMIN B12: Vitamin B-12: 322 pg/mL (ref 211–911)

## 2022-05-12 LAB — TSH: TSH: 4.6 u[IU]/mL (ref 0.35–5.50)

## 2022-05-12 LAB — HEMOGLOBIN A1C: Hgb A1c MFr Bld: 5.2 % (ref 4.6–6.5)

## 2022-05-16 ENCOUNTER — Ambulatory Visit (INDEPENDENT_AMBULATORY_CARE_PROVIDER_SITE_OTHER): Payer: Medicare HMO | Admitting: *Deleted

## 2022-05-16 DIAGNOSIS — Z0000 Encounter for general adult medical examination without abnormal findings: Secondary | ICD-10-CM | POA: Diagnosis not present

## 2022-05-16 NOTE — Progress Notes (Signed)
Subjective:   Ricardo Wilson. is a 81 y.o. male who presents for Medicare Annual/Subsequent preventive examination.  I connected with  Ricardo Wilson. on 05/16/22 by a telephone enabled telemedicine application and verified that I am speaking with the correct person using two identifiers.   I discussed the limitations of evaluation and management by telemedicine. The patient expressed understanding and agreed to proceed.  Patient location: home  Provider location: Tele-Health-home    Review of Systems     Cardiac Risk Factors include: advanced age (>33mn, >>25women);male gender;sedentary lifestyle     Objective:    Today's Vitals   There is no height or weight on file to calculate BMI.     05/16/2022    9:35 AM 01/22/2016    8:13 PM  Advanced Directives  Does Patient Have a Medical Advance Directive? No Yes  Type of Advance Directive  Living will  Would patient like information on creating a medical advance directive? No - Patient declined     Current Medications (verified) Outpatient Encounter Medications as of 05/16/2022  Medication Sig   celecoxib (CELEBREX) 200 MG capsule 1 tab daily by mouth as needed.   cyanocobalamin (,VITAMIN B-12,) 1000 MCG/ML injection 1000 mcg injection IM monthly   pantoprazole (PROTONIX) 20 MG tablet TAKE 2 TABLETS(40 MG) BY MOUTH DAILY   TRELEGY ELLIPTA 200-62.5-25 MCG/ACT AEPB    albuterol (VENTOLIN HFA) 108 (90 Base) MCG/ACT inhaler Inhale 1-2 puffs into the lungs every 6 (six) hours as needed for wheezing or shortness of breath. Ok to fill either proair HFA or ventolin (Patient not taking: Reported on 05/16/2022)   No facility-administered encounter medications on file as of 05/16/2022.    Allergies (verified) Oxycodone-acetaminophen and Aspirin   History: Past Medical History:  Diagnosis Date   Arthritis    COPD (chronic obstructive pulmonary disease) (HCC)    GERD (gastroesophageal reflux disease)    Past Surgical  History:  Procedure Laterality Date   APPENDECTOMY     CARPAL TUNNEL RELEASE Right    COLONOSCOPY  2013   tubular adenoma x2   INGUINAL HERNIA REPAIR Left 01/23/2016   Procedure: LEFT HERNIA REPAIR INGUINAL INCARCERATED;  Surgeon: MDonnie Mesa MD;  Location: [facility] OR;  Service: General;  Laterality: Left;   lung resection     ROTATOR CUFF REPAIR Bilateral    Family History  Problem Relation Age of Onset   Uterine cancer Mother    Alcohol abuse Father    Diabetes Father    Colon cancer Neg Hx    Prostate cancer Neg Hx    Social History   Socioeconomic History   Marital status: Married    Spouse name: Not on file   Number of children: Not on file   Years of education: Not on file   Highest education level: Not on file  Occupational History   Not on file  Tobacco Use   Smoking status: Former   Smokeless tobacco: Never   Tobacco comments:    quit 1986  Substance and Sexual Activity   Alcohol use: Yes    Comment: occ   Drug use: No   Sexual activity: Yes  Other Topics Concern   Not on file  Social History Narrative   [place]   Married 50+ years   2 kids, [place] in [place]  Retired from cProgrammer, applicationswork   Social Determinants of Health   Financial Resource Strain: Low Risk  (05/16/2022)   Overall  Financial Resource Strain (CARDIA)    Difficulty of Paying Living Expenses: Not hard at all  Food Insecurity: No Food Insecurity (05/16/2022)   Hunger Vital Sign    Worried About Running Out of Food in the Last Year: Never true    Ran Out of Food in the Last Year: Never true  Transportation Needs: No Transportation Needs (05/16/2022)   PRAPARE - Hydrologist (Medical): No    Lack of Transportation (Non-Medical): No  Physical Activity: Inactive (05/16/2022)   Exercise Vital Sign    Days of Exercise per Week: 0 days    Minutes of Exercise per Session: 0 min  Stress: No Stress Concern Present (05/16/2022)   [place]    Feeling of Stress : Not at all  Social Connections: Moderately Isolated (05/16/2022)   Social Connection and Isolation Panel [NHANES]    Frequency of Communication with Friends and Family: Three times a week    Frequency of Social Gatherings with Friends and Family: Once a week    Attends Religious Services: Never    Marine scientist or Organizations: No    Attends Music therapist: Never    Marital Status: Married    Tobacco Counseling Counseling given: Not Answered Tobacco comments: quit 1986   Clinical Intake:  Pre-visit preparation completed: Yes  Pain : No/denies pain     Diabetes: No  How often do you need to have someone help you when you read instructions, pamphlets, or other written materials from your doctor or pharmacy?: 1 - Never  Diabetic?  Interpreter Needed?: No  Information entered by :: Ricardo Kennedy LPN   Activities of Daily Living    05/16/2022    9:55 AM 05/16/2022    9:39 AM  In your present state of health, do you have any difficulty performing the following activities:  Hearing? 1 1  Vision? 0 0  Difficulty concentrating or making decisions? 0 0  Walking or climbing stairs? 0 0  Dressing or bathing? 0 0  Doing errands, shopping? 0 0  Preparing Food and eating ? N N  Using the Toilet? N N  In the past six months, have you accidently leaked urine? N N  Do you have problems with loss of bowel control? N N  Managing your Medications? N N  Managing your Finances? N N  Housekeeping or managing your Housekeeping? N N    Patient Care Team: Tonia Ghent, MD as PCP - General (Family Medicine)  Indicate any recent Medical Services you may have received from other than [facility] providers in the past year (date may be approximate).     Assessment:   This is a routine wellness examination for Ricardo Wilson.  Hearing/Vision screen Hearing Screening - Comments::  Bilateral hearing aids Vision Screening - Comments:: Cataract surgery Up to date [place] issues and exercise activities discussed: Current Exercise Habits: The patient does not participate in regular exercise at present, Exercise limited by: respiratory conditions(s)   Goals Addressed             This Visit's Progress    Patient Stated       Continue current lifestyle       Depression Screen    05/16/2022    9:48 AM 05/31/2020   11:57 AM 05/30/2019   11:53 AM 04/08/2018    3:03 PM 03/30/2017    9:02 AM 03/24/2016  9:03 AM 06/05/2014   10:23 AM  PHQ 2/9 Scores  PHQ - 2 Score 0 0 0 0 0 0 0  PHQ- 9 Score 1          Fall Risk    05/16/2022    9:33 AM 05/31/2020   11:57 AM 05/30/2019   11:53 AM 04/08/2018    3:03 PM 03/24/2016    9:03 AM  Fall Risk   Falls in the past year? 0 0 0 Yes Yes  Number falls in past yr: 0 0   2 or more  Injury with Fall? 0 0   No  Follow up Falls evaluation completed;Education provided;Falls prevention discussed Falls evaluation completed       FALL RISK PREVENTION PERTAINING TO THE HOME:  Any stairs in or around the home? No  If so, are there any without handrails? No  Home free of loose throw rugs in walkways, pet beds, electrical cords, etc? Yes  Adequate lighting in your home to reduce risk of falls? Yes   ASSISTIVE DEVICES UTILIZED TO PREVENT FALLS:  Life alert? No  Use of a cane, walker or w/c? No  Grab bars in the bathroom? Yes  Shower chair or bench in shower? Yes  Elevated toilet seat or a handicapped toilet? No   TIMED UP AND GO:  Was the test performed? No .    Cognitive Function:        05/16/2022    9:34 AM  6CIT Screen  What Year? 0 points  What month? 0 points  What time? 0 points  Count back from 20 0 points  Months in reverse 0 points  Repeat phrase 0 points  Total Score 0 points    Immunizations Immunization History  Administered Date(s) Administered   Fluad Quad(high Dose 65+)  05/30/2019, 05/31/2020, 05/17/2021   Influenza Inj Mdck Quad Pf 05/30/2019, 05/31/2020, 05/17/2021   Influenza,inj,Quad PF,6+ Mos 03/24/2016, 03/30/2017, 04/08/2018   Influenza-Unspecified 05/12/2013, 03/24/2016, 03/30/2017, 04/08/2018   PFIZER(Purple Top)SARS-COV-2 Vaccination 08/11/2019, 09/01/2019   Pneumococcal Conjugate-13 05/05/2013   Pneumococcal Polysaccharide-23 04/10/2015   Pneumococcal-Unspecified 07/10/2005   Td 07/11/1999, 04/11/2022   Td (Adult) 07/11/1999   Tdap 06/05/2014   Zoster Recombinat (Shingrix) 04/26/2018, 06/15/2020    TDAP status: Up to date  Flu Vaccine status: Due, Education has been provided regarding the importance of this vaccine. Advised may receive this vaccine at local pharmacy or Health Dept. Aware to provide a copy of the vaccination record if obtained from local pharmacy or Health Dept. Verbalized acceptance and understanding.  Pneumococcal vaccine status: Up to date  Covid-19 vaccine status: Information provided on how to obtain vaccines.   Qualifies for Shingles Vaccine? No   Zostavax completed No   Shingrix Completed?: Yes  Screening Tests Health Maintenance  Topic Date Due   COVID-19 Vaccine (3 - Pfizer risk series) 09/29/2019   INFLUENZA VACCINE  02/07/2022   COLONOSCOPY (Pts 45-72yr Insurance coverage will need to be confirmed)  09/25/2022   Medicare Annual Wellness (AWV)  05/17/2023   TETANUS/TDAP  04/11/2032   Pneumonia Vaccine 80 Years old  Completed   Zoster Vaccines- Shingrix  Completed   HPV VACCINES  Aged Out    Health Maintenance  Health Maintenance Due  Topic Date Due   COVID-19 Vaccine (3 - Pfizer risk series) 09/29/2019   INFLUENZA VACCINE  02/07/2022    Colorectal cancer screening: No longer required.   Lung Cancer Screening: (Low Dose CT Chest recommended if Age 80-80years, 30 pack-year  currently smoking OR have quit w/in 15years.) does not qualify.   Lung Cancer Screening Referral:   Additional  Screening:  Hepatitis C Screening: does not qualify  Vision Screening: Recommended annual ophthalmology exams for early detection of glaucoma and other disorders of the eye. Is the patient up to date with their annual eye exam?  Yes  Who is the provider or what is the name of the office in which the patient attends annual eye exams? [facility] If pt is not established with a provider, would they like to be referred to a provider to establish care? No .   Dental Screening: Recommended annual dental exams for proper oral hygiene  Community Resource Referral / Chronic Care Management: CRR required this visit?  No   CCM required this visit?  No      Plan:     I have personally reviewed and noted the following in the patient's chart:   Medical and social history Use of alcohol, tobacco or illicit drugs  Current medications and supplements including opioid prescriptions. Patient is not currently taking opioid prescriptions. Functional ability and status Nutritional status Physical activity Advanced directives List of other physicians Hospitalizations, surgeries, and ER visits in previous 12 months Vitals Screenings to include cognitive, depression, and falls Referrals and appointments  In addition, I have reviewed and discussed with patient certain preventive protocols, quality metrics, and best practice recommendations. A written personalized care plan for preventive services as well as general preventive health recommendations were provided to patient.     Ricardo Kennedy, LPN   [id number]/02/[id number]   Nurse Notes: patient stated he has been getting lightheaded when he bends over and stands up.  Has not passed out last a few minutes then he is fine.   He has an appointment 11-10 will discuss then.   Vaso Vagal was discussed with patient preventative actions to reduce fall risk.  Also discussed staying hydrated.

## 2022-05-16 NOTE — Patient Instructions (Signed)
Mr. Ricardo Wilson , Thank you for taking time to come for your Medicare Wellness Visit. I appreciate your ongoing commitment to your health goals. Please review the following plan we discussed and let me know if I can assist you in the future.   These are the goals we discussed:  Goals      Patient Stated     Continue current lifestyle        This is a list of the screening recommended for you and due dates:  Health Maintenance  Topic Date Due   COVID-19 Vaccine (3 - Pfizer risk series) 09/29/2019   Flu Shot  02/07/2022   Colon Cancer Screening  09/25/2022   Medicare Annual Wellness Visit  05/17/2023   Tetanus Vaccine  04/11/2032   Pneumonia Vaccine  Completed   Zoster (Shingles) Vaccine  Completed   HPV Vaccine  Aged Out    Advanced directives: Education provided  Conditions/risks identified:   Next appointment: Follow up in one year for your annual wellness visit. 05-19-2022 @ 9:30  Ricardo Wilson  Preventive Care 65 Years and Older, Male  Preventive care refers to lifestyle choices and visits with your health care provider that can promote health and wellness. What does preventive care include? A yearly physical exam. This is also called an annual well check. Dental exams once or twice a year. Routine eye exams. Ask your health care provider how often you should have your eyes checked. Personal lifestyle choices, including: Daily care of your teeth and gums. Regular physical activity. Eating a healthy diet. Avoiding tobacco and drug use. Limiting alcohol use. Practicing safe sex. Taking low doses of aspirin every day. Taking vitamin and mineral supplements as recommended by your health care provider. What happens during an annual well check? The services and screenings done by your health care provider during your annual well check will depend on your age, overall health, lifestyle risk factors, and family history of disease. Counseling  Your health care provider may ask you  questions about your: Alcohol use. Tobacco use. Drug use. Emotional well-being. Home and relationship well-being. Sexual activity. Eating habits. History of falls. Memory and ability to understand (cognition). Work and work Statistician. Screening  You may have the following tests or measurements: Height, weight, and BMI. Blood pressure. Lipid and cholesterol levels. These may be checked every 5 years, or more frequently if you are over 45 years old. Skin check. Lung cancer screening. You may have this screening every year starting at age 66 if you have a 30-pack-year history of smoking and currently smoke or have quit within the past 15 years. Fecal occult blood test (FOBT) of the stool. You may have this test every year starting at age 49. Flexible sigmoidoscopy or colonoscopy. You may have a sigmoidoscopy every 5 years or a colonoscopy every 10 years starting at age 2. Prostate cancer screening. Recommendations will vary depending on your family history and other risks. Hepatitis C blood test. Hepatitis B blood test. Sexually transmitted disease (STD) testing. Diabetes screening. This is done by checking your blood sugar (glucose) after you have not eaten for a while (fasting). You may have this done every 1-3 years. Abdominal aortic aneurysm (AAA) screening. You may need this if you are a current or former smoker. Osteoporosis. You may be screened starting at age 59 if you are at high risk. Talk with your health care provider about your test results, treatment options, and if necessary, the need for more tests. Vaccines  Your health care  provider may recommend certain vaccines, such as: Influenza vaccine. This is recommended every year. Tetanus, diphtheria, and acellular pertussis (Tdap, Td) vaccine. You may need a Td booster every 10 years. Zoster vaccine. You may need this after age 18. Pneumococcal 13-valent conjugate (PCV13) vaccine. One dose is recommended after age  76. Pneumococcal polysaccharide (PPSV23) vaccine. One dose is recommended after age 31. Talk to your health care provider about which screenings and vaccines you need and how often you need them. This information is not intended to replace advice given to you by your health care provider. Make sure you discuss any questions you have with your health care provider. Document Released: 07/23/2015 Document Revised: 03/15/2016 Document Reviewed: 04/27/2015 Elsevier Interactive Patient Education  2017 [place] Prevention in the Home Falls can cause injuries. They can happen to people of all ages. There are many things you can do to make your home safe and to help prevent falls. What can I do on the outside of my home? Regularly fix the edges of walkways and driveways and fix any cracks. Remove anything that might make you trip as you walk through a door, such as a raised step or threshold. Trim any bushes or trees on the path to your home. Use bright outdoor lighting. Clear any walking paths of anything that might make someone trip, such as rocks or tools. Regularly check to see if handrails are loose or broken. Make sure that both sides of any steps have handrails. Any raised decks and porches should have guardrails on the edges. Have any leaves, snow, or ice cleared regularly. Use sand or salt on walking paths during winter. Clean up any spills in your garage right away. This includes oil or grease spills. What can I do in the bathroom? Use night lights. Install grab bars by the toilet and in the tub and shower. Do not use towel bars as grab bars. Use non-skid mats or decals in the tub or shower. If you need to sit down in the shower, use a plastic, non-slip stool. Keep the floor dry. Clean up any water that spills on the floor as soon as it happens. Remove soap buildup in the tub or shower regularly. Attach bath mats securely with double-sided non-slip rug tape. Do not have throw  rugs and other things on the floor that can make you trip. What can I do in the bedroom? Use night lights. Make sure that you have a light by your bed that is easy to reach. Do not use any sheets or blankets that are too big for your bed. They should not hang down onto the floor. Have a firm chair that has side arms. You can use this for support while you get dressed. Do not have throw rugs and other things on the floor that can make you trip. What can I do in the kitchen? Clean up any spills right away. Avoid walking on wet floors. Keep items that you use a lot in easy-to-reach places. If you need to reach something above you, use a strong step stool that has a grab bar. Keep electrical cords out of the way. Do not use floor polish or wax that makes floors slippery. If you must use wax, use non-skid floor wax. Do not have throw rugs and other things on the floor that can make you trip. What can I do with my stairs? Do not leave any items on the stairs. Make sure that there are handrails on both  sides of the stairs and use them. Fix handrails that are broken or loose. Make sure that handrails are as long as the stairways. Check any carpeting to make sure that it is firmly attached to the stairs. Fix any carpet that is loose or worn. Avoid having throw rugs at the top or bottom of the stairs. If you do have throw rugs, attach them to the floor with carpet tape. Make sure that you have a light switch at the top of the stairs and the bottom of the stairs. If you do not have them, ask someone to add them for you. What else can I do to help prevent falls? Wear shoes that: Do not have high heels. Have rubber bottoms. Are comfortable and fit you well. Are closed at the toe. Do not wear sandals. If you use a stepladder: Make sure that it is fully opened. Do not climb a closed stepladder. Make sure that both sides of the stepladder are locked into place. Ask someone to hold it for you, if  possible. Clearly mark and make sure that you can see: Any grab bars or handrails. First and last steps. Where the edge of each step is. Use tools that help you move around (mobility aids) if they are needed. These include: Canes. Walkers. Scooters. Crutches. Turn on the lights when you go into a dark area. Replace any light bulbs as soon as they burn out. Set up your furniture so you have a clear path. Avoid moving your furniture around. If any of your floors are uneven, fix them. If there are any pets around you, be aware of where they are. Review your medicines with your doctor. Some medicines can make you feel dizzy. This can increase your chance of falling. Ask your doctor what other things that you can do to help prevent falls. This information is not intended to replace advice given to you by your health care provider. Make sure you discuss any questions you have with your health care provider. Document Released: 04/22/2009 Document Revised: 12/02/2015 Document Reviewed: 07/31/2014 Elsevier Interactive Patient Education  2017 [employer].

## 2022-05-19 ENCOUNTER — Ambulatory Visit (INDEPENDENT_AMBULATORY_CARE_PROVIDER_SITE_OTHER): Payer: Medicare HMO | Admitting: Family Medicine

## 2022-05-19 ENCOUNTER — Encounter: Payer: Self-pay | Admitting: Family Medicine

## 2022-05-19 VITALS — BP 120/64 | HR 76 | Temp 97.6°F | Ht 68.0 in | Wt 172.0 lb

## 2022-05-19 DIAGNOSIS — Z7189 Other specified counseling: Secondary | ICD-10-CM

## 2022-05-19 DIAGNOSIS — J449 Chronic obstructive pulmonary disease, unspecified: Secondary | ICD-10-CM | POA: Diagnosis not present

## 2022-05-19 DIAGNOSIS — Z23 Encounter for immunization: Secondary | ICD-10-CM

## 2022-05-19 DIAGNOSIS — Z0000 Encounter for general adult medical examination without abnormal findings: Secondary | ICD-10-CM

## 2022-05-19 DIAGNOSIS — E538 Deficiency of other specified B group vitamins: Secondary | ICD-10-CM | POA: Diagnosis not present

## 2022-05-19 DIAGNOSIS — R2689 Other abnormalities of gait and mobility: Secondary | ICD-10-CM

## 2022-05-19 MED ORDER — CYANOCOBALAMIN 1000 MCG/ML IJ SOLN
1000.0000 ug | Freq: Once | INTRAMUSCULAR | Status: AC
  Administered 2022-05-19: 1000 ug via INTRAMUSCULAR

## 2022-05-19 MED ORDER — ALBUTEROL SULFATE HFA 108 (90 BASE) MCG/ACT IN AERS: 1.0000 | INHALATION_SPRAY | Freq: Four times a day (QID) | RESPIRATORY_TRACT | 2 refills | Status: AC | PRN

## 2022-05-19 NOTE — Progress Notes (Unsigned)
B12 def.  On replacement.  B12 wnl.  D/w pt.  No ADE on med.   COPD.  He is off inhalers in the meantime.  He had pulmonary f/u in the meantime.  He couldn't tell a change with inhaler use.  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    When leaning over he doesn't have sx but then when getting back up he'll have tightness on the back of the neck.  He has similar sx after looking down at a phone, where he feels facial tightness after he looks upward.    He can occ feel off balance after standing from squatting or leaning over.  No rooms spinning.  Minimally lightheaded briefly.  No syncope.    Flu up-to-date Shingles up-to-date PNA up-to-date Tetanus 2015 COVID-vaccine previously done RSV vaccine d/w pt.   Colonoscopy 2019 Prostate cancer screening deferred given his age. Advance directive-wife first then if needed sons equally designated if patient were incapacitated.  Meds, vitals, and allergies reviewed.   ROS: Per HPI unless specifically indicated in ROS section   GEN: nad, alert and oriented HEENT: ncat NECK: supple w/o LA CV: rrr. PULM: ctab, no inc wob ABD: soft, +bs EXT: no edema SKIN: Well-perfused.  32 minutes were devoted to patient care in this encounter (this includes time spent reviewing the patient's file/history, interviewing and examining the patient, counseling/reviewing plan with patient).

## 2022-05-19 NOTE — Patient Instructions (Signed)
Try restarting albuterol and either way let me know how it goes.  Take care.  Glad to see you.

## 2022-05-21 DIAGNOSIS — Z0000 Encounter for general adult medical examination without abnormal findings: Secondary | ICD-10-CM | POA: Insufficient documentation

## 2022-05-21 NOTE — Assessment & Plan Note (Signed)
Several potential issues.  He could have muscle tightness on the posterior part of his neck when he is squatting over.  He could have eyestrain from prolonged cell phone use, looking down at the screen.  He could also be mildly orthostatic when he gets back up from a squatting position.  Discussed posterior and orthostatic cautions, make sure to drink plenty of fluid, and update me as needed.  He agrees with plan.

## 2022-05-21 NOTE — Assessment & Plan Note (Signed)
Advance directive-wife first then if needed sons equally designated if patient were incapacitated.

## 2022-05-21 NOTE — Assessment & Plan Note (Signed)
Flu up-to-date Shingles up-to-date PNA up-to-date Tetanus 2015 COVID-vaccine previously done RSV vaccine d/w pt.   Colonoscopy 2019 Prostate cancer screening deferred given his age. Advance directive-wife first then if needed sons equally designated if patient were incapacitated.

## 2022-05-21 NOTE — Assessment & Plan Note (Signed)
B12 dose done today.  Continue replacement as is.

## 2022-05-21 NOTE — Assessment & Plan Note (Signed)
Lungs are clear.  Discussed retrial of albuterol and either way he can let me know if he has any effect with that.  We will go from there.

## 2022-05-23 ENCOUNTER — Ambulatory Visit: Payer: Medicare HMO

## 2022-06-15 ENCOUNTER — Other Ambulatory Visit: Payer: Self-pay | Admitting: Family Medicine

## 2022-06-26 DIAGNOSIS — J439 Emphysema, unspecified: Secondary | ICD-10-CM | POA: Diagnosis not present

## 2022-06-26 DIAGNOSIS — J449 Chronic obstructive pulmonary disease, unspecified: Secondary | ICD-10-CM | POA: Diagnosis not present

## 2022-06-27 ENCOUNTER — Ambulatory Visit (INDEPENDENT_AMBULATORY_CARE_PROVIDER_SITE_OTHER): Payer: Medicare HMO

## 2022-06-27 DIAGNOSIS — E538 Deficiency of other specified B group vitamins: Secondary | ICD-10-CM | POA: Diagnosis not present

## 2022-06-27 MED ORDER — CYANOCOBALAMIN 1000 MCG/ML IJ SOLN
1000.0000 ug | Freq: Once | INTRAMUSCULAR | Status: AC
  Administered 2022-06-27: 1000 ug via INTRAMUSCULAR

## 2022-06-27 NOTE — Progress Notes (Signed)
Per orders of Dr. Elsie Stain, injection of Vitamin B 12 given by Ozzie Hoyle. Patient tolerated injection well.

## 2022-07-04 DIAGNOSIS — S61412A Laceration without foreign body of left hand, initial encounter: Secondary | ICD-10-CM | POA: Diagnosis not present

## 2022-07-04 DIAGNOSIS — W548XXA Other contact with dog, initial encounter: Secondary | ICD-10-CM | POA: Diagnosis not present

## 2022-07-25 DIAGNOSIS — R0600 Dyspnea, unspecified: Secondary | ICD-10-CM | POA: Diagnosis not present

## 2022-07-25 DIAGNOSIS — I4510 Unspecified right bundle-branch block: Secondary | ICD-10-CM | POA: Diagnosis not present

## 2022-08-01 ENCOUNTER — Ambulatory Visit (INDEPENDENT_AMBULATORY_CARE_PROVIDER_SITE_OTHER): Payer: Medicare HMO | Admitting: *Deleted

## 2022-08-01 DIAGNOSIS — E538 Deficiency of other specified B group vitamins: Secondary | ICD-10-CM

## 2022-08-01 DIAGNOSIS — G6181 Chronic inflammatory demyelinating polyneuritis: Secondary | ICD-10-CM | POA: Diagnosis not present

## 2022-08-01 DIAGNOSIS — I10 Essential (primary) hypertension: Secondary | ICD-10-CM | POA: Diagnosis not present

## 2022-08-01 MED ORDER — CYANOCOBALAMIN 1000 MCG/ML IJ SOLN
1000.0000 ug | Freq: Once | INTRAMUSCULAR | Status: AC
  Administered 2022-08-01: 1000 ug via INTRAMUSCULAR

## 2022-08-01 NOTE — Progress Notes (Signed)
Per orders of Dr. Gutierrez, injection of Vitamin B-12 given by Hardin Hardenbrook. Patient tolerated injection well. 

## 2022-08-08 DIAGNOSIS — I371 Nonrheumatic pulmonary valve insufficiency: Secondary | ICD-10-CM | POA: Diagnosis not present

## 2022-08-08 DIAGNOSIS — I081 Rheumatic disorders of both mitral and tricuspid valves: Secondary | ICD-10-CM | POA: Diagnosis not present

## 2022-08-08 DIAGNOSIS — I517 Cardiomegaly: Secondary | ICD-10-CM | POA: Diagnosis not present

## 2022-08-22 DIAGNOSIS — R0602 Shortness of breath: Secondary | ICD-10-CM | POA: Diagnosis not present

## 2022-09-05 ENCOUNTER — Ambulatory Visit (INDEPENDENT_AMBULATORY_CARE_PROVIDER_SITE_OTHER): Payer: Medicare HMO

## 2022-09-05 DIAGNOSIS — E538 Deficiency of other specified B group vitamins: Secondary | ICD-10-CM | POA: Diagnosis not present

## 2022-09-05 MED ORDER — CYANOCOBALAMIN 1000 MCG/ML IJ SOLN
1000.0000 ug | Freq: Once | INTRAMUSCULAR | Status: AC
  Administered 2022-09-05: 1000 ug via INTRAMUSCULAR

## 2022-09-05 NOTE — Progress Notes (Signed)
Per orders of Dr. Elsie Stain, injection of Vitamin B 12 given in left deltoid given by Ozzie Hoyle. Patient tolerated injection well.

## 2022-09-07 DIAGNOSIS — R0609 Other forms of dyspnea: Secondary | ICD-10-CM | POA: Diagnosis not present

## 2022-09-07 DIAGNOSIS — J432 Centrilobular emphysema: Secondary | ICD-10-CM | POA: Diagnosis not present

## 2022-09-25 DIAGNOSIS — J439 Emphysema, unspecified: Secondary | ICD-10-CM | POA: Diagnosis not present

## 2022-09-25 DIAGNOSIS — J449 Chronic obstructive pulmonary disease, unspecified: Secondary | ICD-10-CM | POA: Diagnosis not present

## 2022-10-05 ENCOUNTER — Ambulatory Visit (INDEPENDENT_AMBULATORY_CARE_PROVIDER_SITE_OTHER): Payer: Medicare HMO

## 2022-10-05 DIAGNOSIS — E538 Deficiency of other specified B group vitamins: Secondary | ICD-10-CM | POA: Diagnosis not present

## 2022-10-05 MED ORDER — CYANOCOBALAMIN 1000 MCG/ML IJ SOLN
1000.0000 ug | Freq: Once | INTRAMUSCULAR | Status: AC
  Administered 2022-10-05: 1000 ug via INTRAMUSCULAR

## 2022-10-05 NOTE — Progress Notes (Signed)
Per orders of Dr. Duncan, injection of vit B12 given by Meshawn Oconnor. Patient tolerated injection well.  

## 2022-10-18 DIAGNOSIS — H524 Presbyopia: Secondary | ICD-10-CM | POA: Diagnosis not present

## 2022-10-18 DIAGNOSIS — Z961 Presence of intraocular lens: Secondary | ICD-10-CM | POA: Diagnosis not present

## 2022-10-18 DIAGNOSIS — Z135 Encounter for screening for eye and ear disorders: Secondary | ICD-10-CM | POA: Diagnosis not present

## 2022-10-18 DIAGNOSIS — H5203 Hypermetropia, bilateral: Secondary | ICD-10-CM | POA: Diagnosis not present

## 2022-10-18 DIAGNOSIS — H52223 Regular astigmatism, bilateral: Secondary | ICD-10-CM | POA: Diagnosis not present

## 2022-10-23 DIAGNOSIS — R0609 Other forms of dyspnea: Secondary | ICD-10-CM | POA: Diagnosis not present

## 2022-10-23 DIAGNOSIS — J432 Centrilobular emphysema: Secondary | ICD-10-CM | POA: Diagnosis not present

## 2022-10-30 DIAGNOSIS — R0609 Other forms of dyspnea: Secondary | ICD-10-CM | POA: Diagnosis not present

## 2022-11-06 IMAGING — DX DG CHEST 2V
2 series · 2 of 2 positions shown · non-contrast
Comparison: 04/08/2018

CLINICAL DATA: Shortness of breath, COPD

EXAM:
CHEST - 2 VIEW

[chest pa]
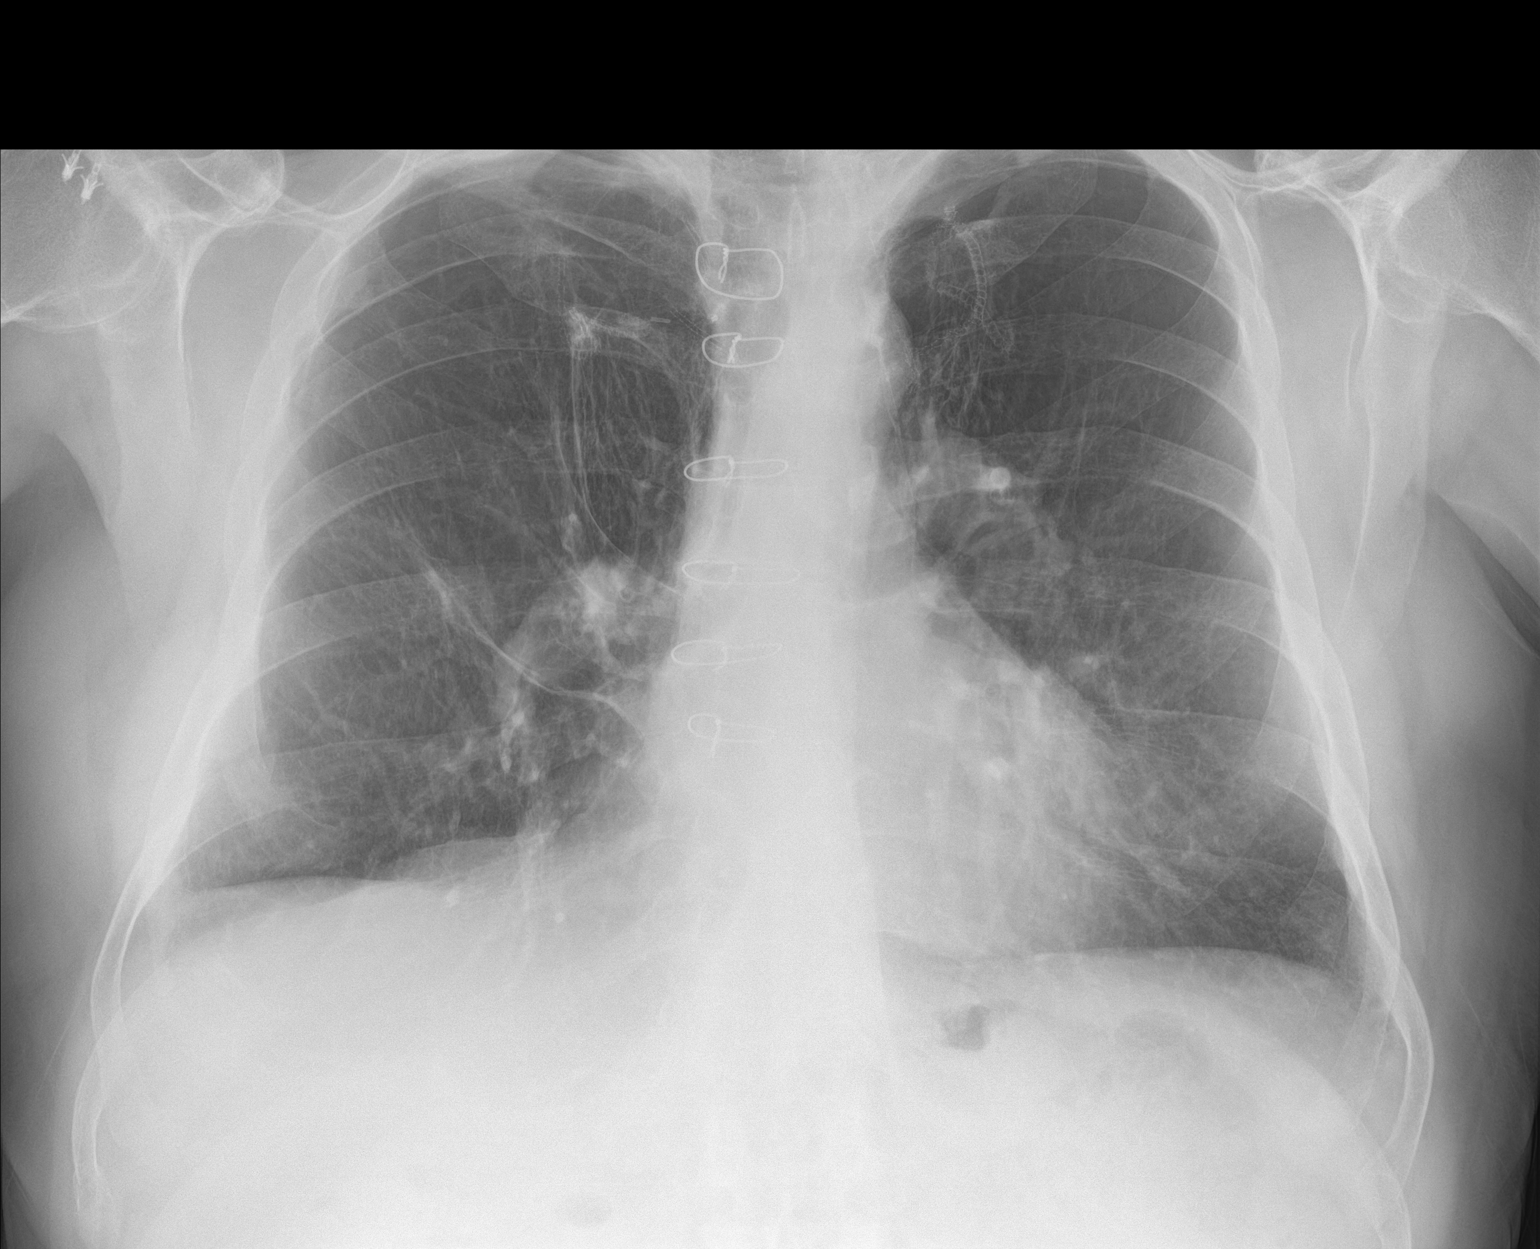

[chest lat]
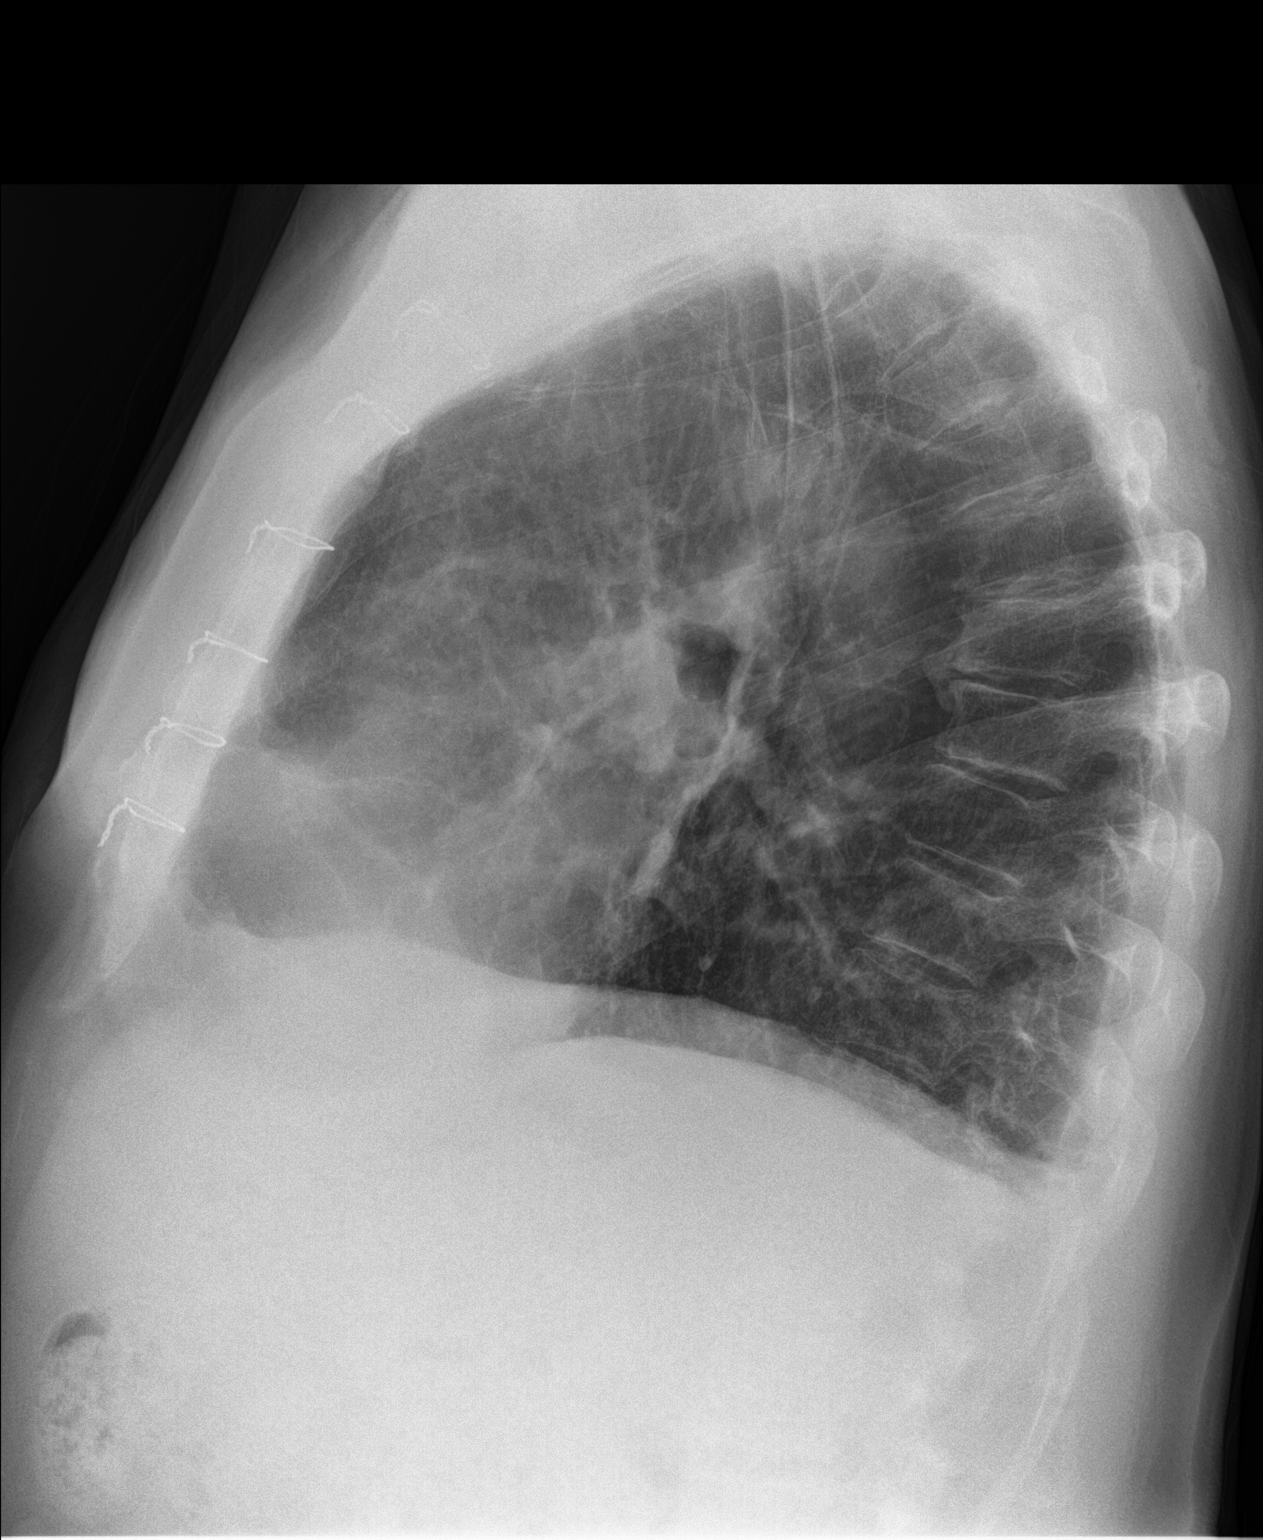

[2 of 2 positions shown; findings below may reference images not displayed]

FINDINGS: Post sternotomy changes. Emphysematous disease. Postsurgical changes
within the upper lungs bilaterally with scarring on the right.
Probable linear scarring in the right infrahilar lung. Stable
cardiomediastinal silhouette. No pneumothorax.
IMPRESSION: No active cardiopulmonary disease. Emphysematous disease.
Postsurgical changes and scarring.

## 2022-11-08 ENCOUNTER — Ambulatory Visit (INDEPENDENT_AMBULATORY_CARE_PROVIDER_SITE_OTHER): Payer: Medicare HMO

## 2022-11-08 DIAGNOSIS — E538 Deficiency of other specified B group vitamins: Secondary | ICD-10-CM

## 2022-11-08 MED ORDER — CYANOCOBALAMIN 1000 MCG/ML IJ SOLN
1000.0000 ug | Freq: Once | INTRAMUSCULAR | Status: AC
Start: 2022-11-08 — End: 2022-11-08
  Administered 2022-11-08: 1000 ug via INTRAMUSCULAR

## 2022-11-08 NOTE — Progress Notes (Signed)
Per orders of Dr. Crawford Givens who is out of office and Dr Eustaquio Boyden who is in office, injection of Vitamin B 12 given in left deltoid given by Lewanda Rife.Patient tolerated injection well. Pt scheduled monthly appt for B 12 injection.

## 2022-12-13 ENCOUNTER — Ambulatory Visit (INDEPENDENT_AMBULATORY_CARE_PROVIDER_SITE_OTHER): Payer: Medicare HMO

## 2022-12-13 DIAGNOSIS — E538 Deficiency of other specified B group vitamins: Secondary | ICD-10-CM

## 2022-12-13 MED ORDER — CYANOCOBALAMIN 1000 MCG/ML IJ SOLN
1000.0000 ug | Freq: Once | INTRAMUSCULAR | Status: AC
Start: 2022-12-13 — End: 2022-12-13
  Administered 2022-12-13: 1000 ug via INTRAMUSCULAR

## 2022-12-13 NOTE — Progress Notes (Signed)
Per orders of Dr. Javier Gutierrez, injection of vitamin b 12 given by Deyna Carbon in right deltoid. Patient tolerated injection well. Patient will make appointment for 1 month.   

## 2023-01-18 ENCOUNTER — Ambulatory Visit: Payer: Medicare HMO

## 2023-01-24 ENCOUNTER — Ambulatory Visit: Payer: Medicare HMO

## 2023-01-24 DIAGNOSIS — E538 Deficiency of other specified B group vitamins: Secondary | ICD-10-CM

## 2023-01-24 MED ORDER — CYANOCOBALAMIN 1000 MCG/ML IJ SOLN
1000.0000 ug | Freq: Once | INTRAMUSCULAR | Status: AC
Start: 2023-01-24 — End: 2023-01-24
  Administered 2023-01-24: 1000 ug via INTRAMUSCULAR

## 2023-01-24 NOTE — Progress Notes (Signed)
 Patient presented for B 12 injection given by Jessica Isley, CMA to left deltoid, patient voiced no concerns nor showed any signs of distress during injection.  

## 2023-02-12 DIAGNOSIS — J439 Emphysema, unspecified: Secondary | ICD-10-CM | POA: Diagnosis not present

## 2023-02-12 DIAGNOSIS — J449 Chronic obstructive pulmonary disease, unspecified: Secondary | ICD-10-CM | POA: Diagnosis not present

## 2023-02-27 ENCOUNTER — Ambulatory Visit (INDEPENDENT_AMBULATORY_CARE_PROVIDER_SITE_OTHER): Payer: Medicare HMO

## 2023-02-27 DIAGNOSIS — E538 Deficiency of other specified B group vitamins: Secondary | ICD-10-CM

## 2023-02-27 MED ORDER — CYANOCOBALAMIN 1000 MCG/ML IJ SOLN
1000.0000 ug | Freq: Once | INTRAMUSCULAR | Status: AC
Start: 2023-02-27 — End: 2023-02-27
  Administered 2023-02-27: 1000 ug via INTRAMUSCULAR

## 2023-02-27 NOTE — Progress Notes (Signed)
Per orders of Dr. Graham Duncan, injection of vitamin b 12 given by Rena Isley in right deltoid. Patient tolerated injection well. Patient will make appointment for 1 month.   

## 2023-03-27 DIAGNOSIS — H903 Sensorineural hearing loss, bilateral: Secondary | ICD-10-CM | POA: Diagnosis not present

## 2023-04-03 ENCOUNTER — Ambulatory Visit (INDEPENDENT_AMBULATORY_CARE_PROVIDER_SITE_OTHER): Payer: Medicare HMO

## 2023-04-03 DIAGNOSIS — E538 Deficiency of other specified B group vitamins: Secondary | ICD-10-CM

## 2023-04-03 MED ORDER — CYANOCOBALAMIN 1000 MCG/ML IJ SOLN
1000.0000 ug | Freq: Once | INTRAMUSCULAR | Status: AC
Start: 2023-04-03 — End: 2023-04-03
  Administered 2023-04-03: 1000 ug via INTRAMUSCULAR

## 2023-04-03 NOTE — Progress Notes (Signed)
Per orders of Dr. Crawford Givens, injection of vitamiin b 12  given by Lewanda Rife in left deltoid. Patient tolerated injection well. Patient will make appointment for 1 month.

## 2023-04-12 DIAGNOSIS — H903 Sensorineural hearing loss, bilateral: Secondary | ICD-10-CM | POA: Diagnosis not present

## 2023-05-07 IMAGING — CT CT CHEST W/O CM
2 of 4 series · 15 of 36 positions shown, 18 images · non-contrast
Comparison: None.

CLINICAL DATA: Lung nodule.  History of COVID.

EXAM:
CT CHEST WITHOUT CONTRAST
TECHNIQUE: Multidetector CT imaging of the chest was performed following the
standard protocol without IV contrast.

[Series 2: thorax · axial · 0.78mm/px · z∈[-322,-52]mm · 12 of 161 slices shown, 15 images]
[im 13/161  mediastinal]
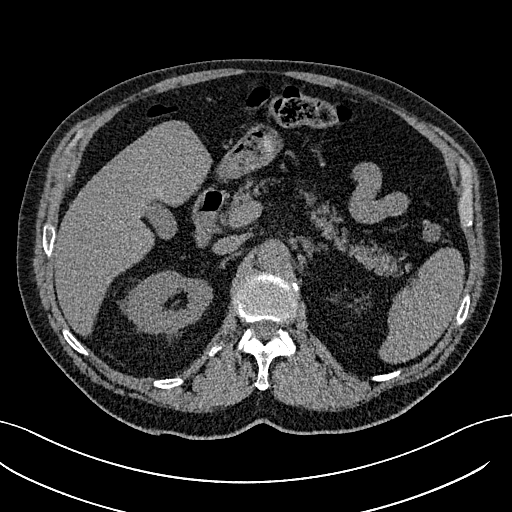
[im 13/161  lung]
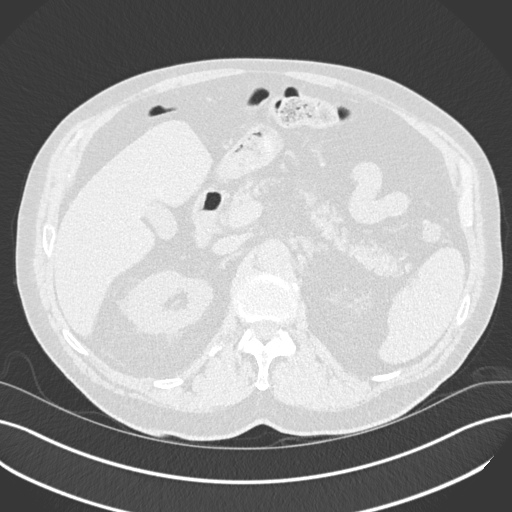
[im 25/161  lung]
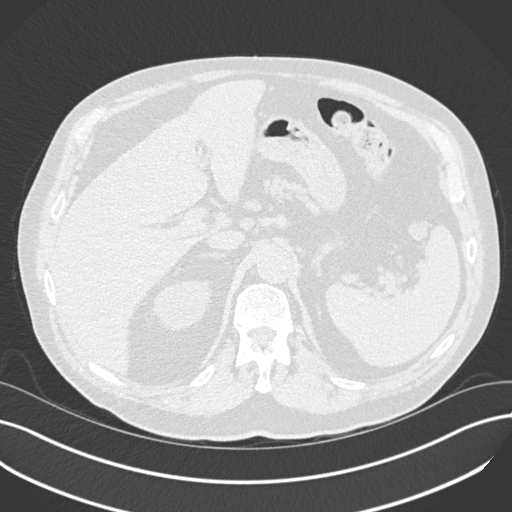
[im 37/161  lung]
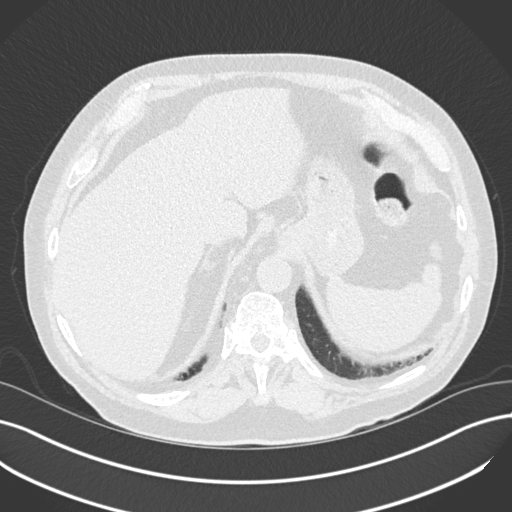
[im 50/161  lung]
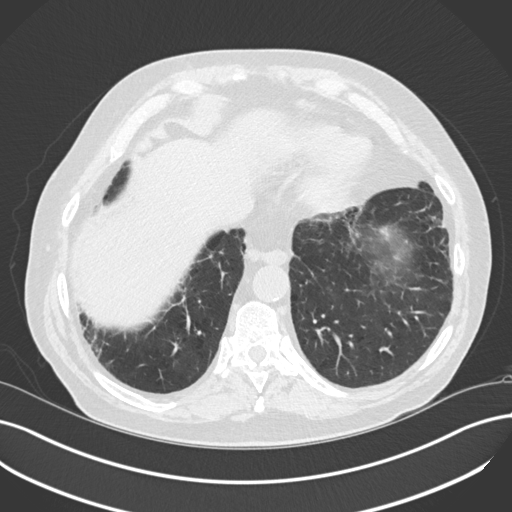
[im 62/161  mediastinal]
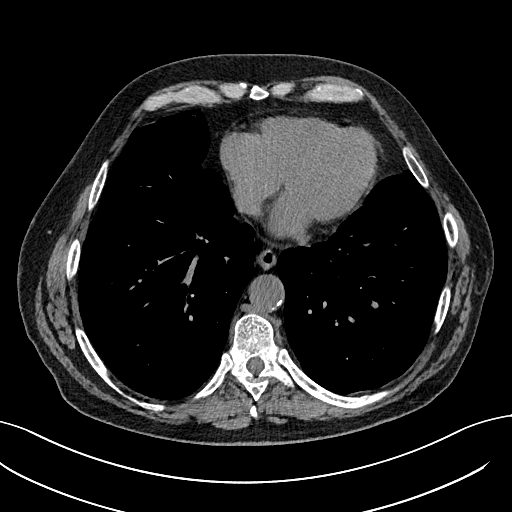
[im 62/161  lung]
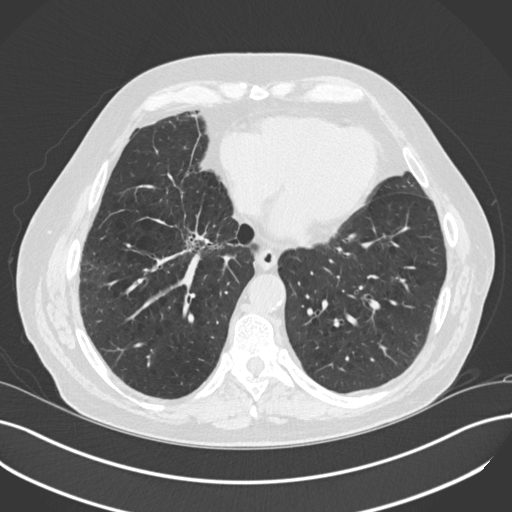
[im 74/161  lung]
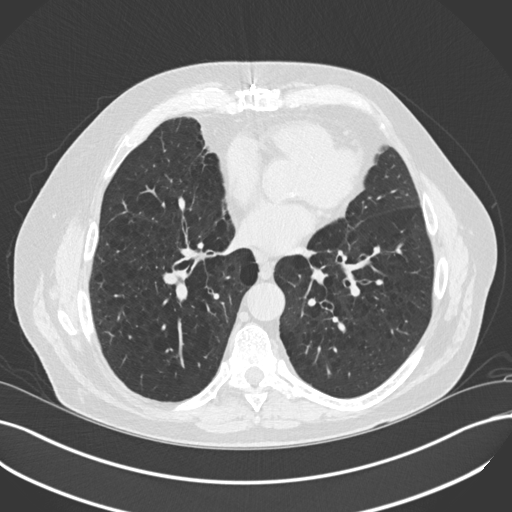
[im 87/161  lung]
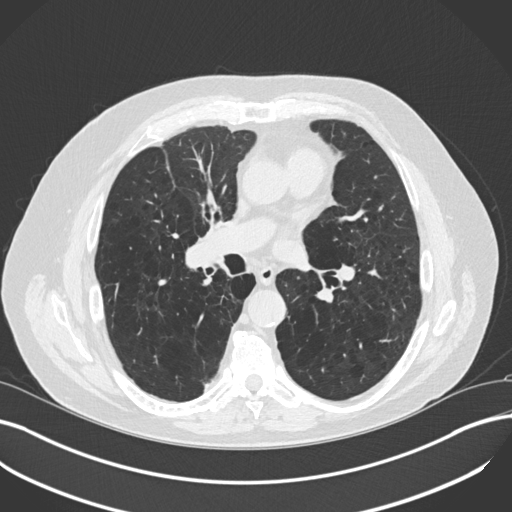
[im 99/161  lung]
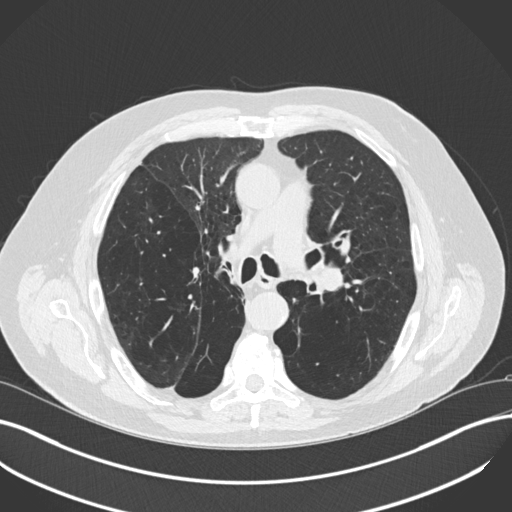
[im 111/161  mediastinal]
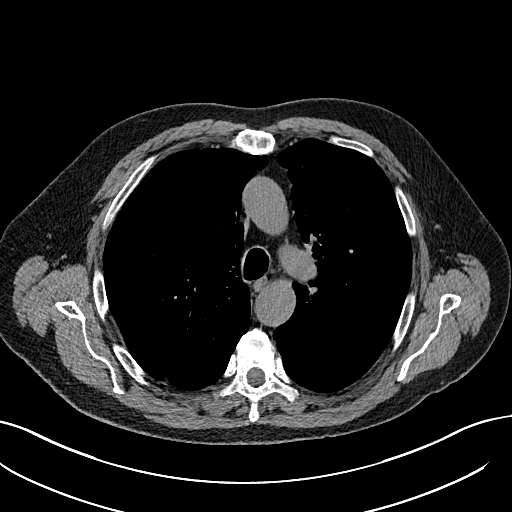
[im 111/161  lung]
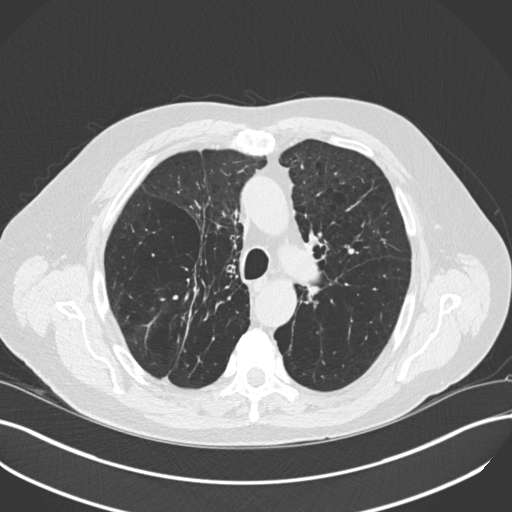
[im 124/161  lung]
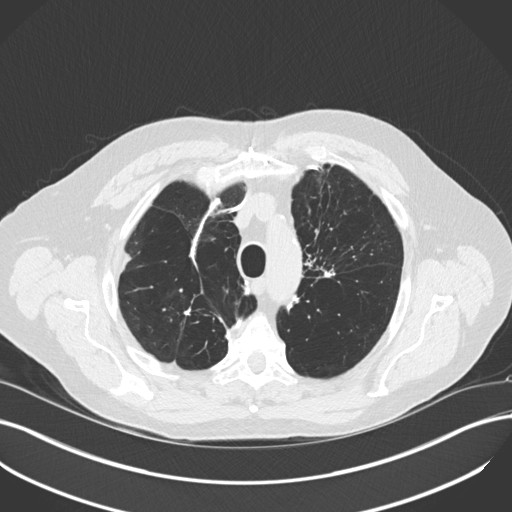
[im 136/161  lung]
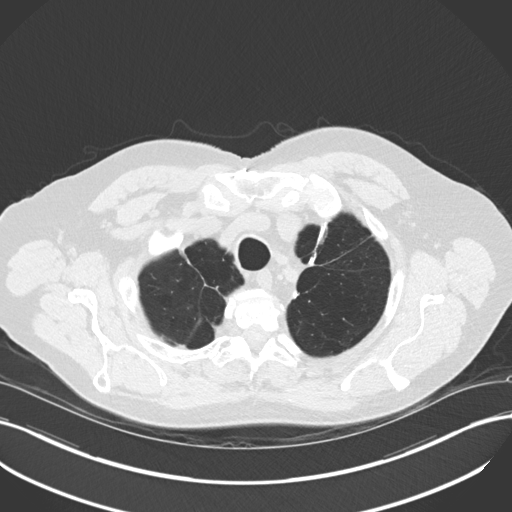
[im 148/161  lung]
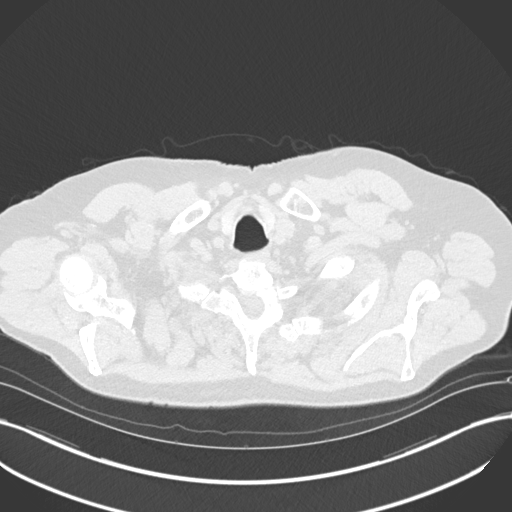

[Series 5: coronal · coronal · 0.63mm/px · 3 of 151 slices shown]
[im 31/151  lung]
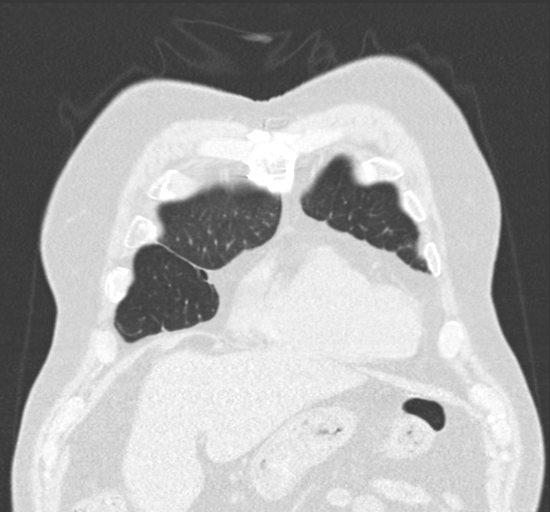
[im 61/151  lung]
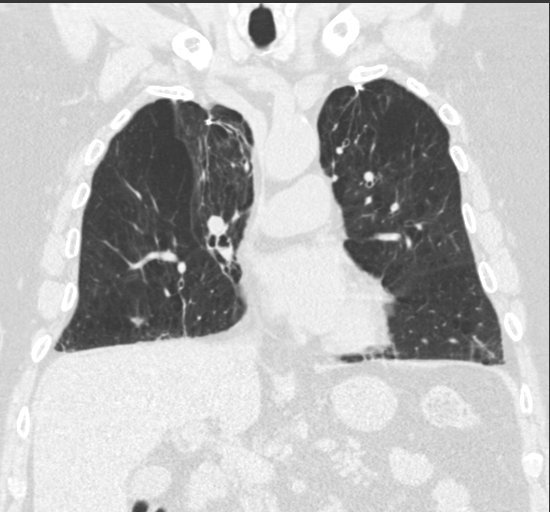
[im 91/151  lung]
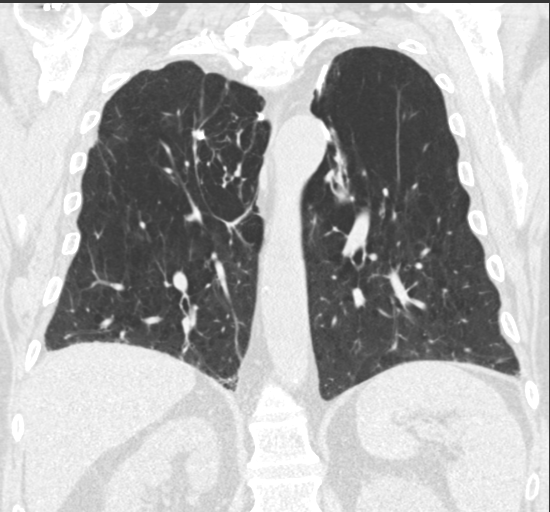

[15 of 36 positions shown; findings below may reference images not displayed]

FINDINGS: Cardiovascular: Atherosclerotic calcification of the aorta and
aortic valve. Pulmonary arteries are enlarged. Heart is at the upper
limits of normal in size. No pericardial effusion.

Mediastinum/Nodes: No pathologically enlarged mediastinal or
axillary lymph nodes. Hilar regions are difficult to definitively
evaluate without IV contrast. Esophagus is grossly unremarkable.

Lungs/Pleura: Postoperative scarring bilaterally. Severe
centrilobular and paraseptal emphysema. No suspicious pulmonary
nodules. No pleural fluid. Airway is unremarkable.

Upper Abdomen: Visualized portions of the liver, gallbladder,
adrenal glands, kidneys, spleen, pancreas, stomach and bowel are
unremarkable. No upper abdominal adenopathy.

Musculoskeletal: Degenerative changes in the spine. No worrisome
lytic or sclerotic lesions.
IMPRESSION: 1. No acute findings.  No suspicious pulmonary nodules.
2. Severe Emphysema ([id number]) with bilateral postoperative
scarring.
3.  Aortic atherosclerosis ([id number]).
4. Enlarged pulmonary arteries, indicative of pulmonary arterial
hypertension.

## 2023-05-10 ENCOUNTER — Ambulatory Visit: Payer: Medicare HMO

## 2023-05-14 ENCOUNTER — Ambulatory Visit (INDEPENDENT_AMBULATORY_CARE_PROVIDER_SITE_OTHER): Payer: Medicare HMO

## 2023-05-14 DIAGNOSIS — E538 Deficiency of other specified B group vitamins: Secondary | ICD-10-CM

## 2023-05-14 MED ORDER — CYANOCOBALAMIN 1000 MCG/ML IJ SOLN
1000.0000 ug | Freq: Once | INTRAMUSCULAR | Status: AC
Start: 2023-05-14 — End: 2023-05-14
  Administered 2023-05-14: 1000 ug via INTRAMUSCULAR

## 2023-05-14 NOTE — Progress Notes (Signed)
Patient presented for B 12 injection given by Makinzy Cleere, CMA to right deltoid, patient voiced no concerns nor showed any signs of distress during injection.  

## 2023-05-20 ENCOUNTER — Other Ambulatory Visit: Payer: Self-pay | Admitting: Family Medicine

## 2023-05-20 DIAGNOSIS — R7989 Other specified abnormal findings of blood chemistry: Secondary | ICD-10-CM

## 2023-05-20 DIAGNOSIS — E786 Lipoprotein deficiency: Secondary | ICD-10-CM

## 2023-05-20 DIAGNOSIS — R739 Hyperglycemia, unspecified: Secondary | ICD-10-CM

## 2023-05-20 DIAGNOSIS — E538 Deficiency of other specified B group vitamins: Secondary | ICD-10-CM

## 2023-05-24 ENCOUNTER — Other Ambulatory Visit (INDEPENDENT_AMBULATORY_CARE_PROVIDER_SITE_OTHER): Payer: Medicare HMO

## 2023-05-24 DIAGNOSIS — E786 Lipoprotein deficiency: Secondary | ICD-10-CM | POA: Diagnosis not present

## 2023-05-24 DIAGNOSIS — R946 Abnormal results of thyroid function studies: Secondary | ICD-10-CM | POA: Diagnosis not present

## 2023-05-24 DIAGNOSIS — E538 Deficiency of other specified B group vitamins: Secondary | ICD-10-CM

## 2023-05-24 DIAGNOSIS — R7989 Other specified abnormal findings of blood chemistry: Secondary | ICD-10-CM

## 2023-05-24 DIAGNOSIS — R739 Hyperglycemia, unspecified: Secondary | ICD-10-CM

## 2023-05-24 LAB — LIPID PANEL
Cholesterol: 97 mg/dL (ref 0–200)
HDL: 37.8 mg/dL — ABNORMAL LOW (ref >39.00)
LDL Cholesterol: 45 mg/dL (ref 0–99)
NonHDL: 59.09
Total CHOL/HDL Ratio: 3
Triglycerides: 68 mg/dL (ref 0.0–149.0)
VLDL: 13.6 mg/dL (ref 0.0–40.0)

## 2023-05-24 LAB — COMPREHENSIVE METABOLIC PANEL
ALT: 19 U/L (ref 0–53)
AST: 20 U/L (ref 0–37)
Albumin: 4.3 g/dL (ref 3.5–5.2)
Alkaline Phosphatase: 64 U/L (ref 39–117)
BUN: 23 mg/dL (ref 6–23)
CO2: 29 meq/L (ref 19–32)
Calcium: 9.6 mg/dL (ref 8.4–10.5)
Chloride: 104 meq/L (ref 96–112)
Creatinine, Ser: 1.14 mg/dL (ref 0.40–1.50)
GFR: 60.35 mL/min (ref >60.00)
Glucose, Bld: 88 mg/dL (ref 70–99)
Potassium: 4.6 meq/L (ref 3.5–5.1)
Sodium: 140 meq/L (ref 135–145)
Total Bilirubin: 1 mg/dL (ref 0.2–1.2)
Total Protein: 6.9 g/dL (ref 6.0–8.3)

## 2023-05-24 LAB — HEMOGLOBIN A1C: Hgb A1c MFr Bld: 5.4 % (ref 4.6–6.5)

## 2023-05-24 LAB — TSH: TSH: 5.51 u[IU]/mL — ABNORMAL HIGH (ref 0.35–5.50)

## 2023-05-24 LAB — VITAMIN B12: Vitamin B-12: 417 pg/mL (ref 211–911)

## 2023-05-31 ENCOUNTER — Encounter: Payer: Self-pay | Admitting: Family Medicine

## 2023-05-31 ENCOUNTER — Ambulatory Visit (INDEPENDENT_AMBULATORY_CARE_PROVIDER_SITE_OTHER): Payer: Medicare HMO | Admitting: Family Medicine

## 2023-05-31 VITALS — BP 128/72 | HR 80 | Temp 97.9°F | Ht 65.0 in | Wt 173.0 lb

## 2023-05-31 DIAGNOSIS — E785 Hyperlipidemia, unspecified: Secondary | ICD-10-CM | POA: Diagnosis not present

## 2023-05-31 DIAGNOSIS — M1990 Unspecified osteoarthritis, unspecified site: Secondary | ICD-10-CM | POA: Diagnosis not present

## 2023-05-31 DIAGNOSIS — R7989 Other specified abnormal findings of blood chemistry: Secondary | ICD-10-CM | POA: Diagnosis not present

## 2023-05-31 DIAGNOSIS — J449 Chronic obstructive pulmonary disease, unspecified: Secondary | ICD-10-CM

## 2023-05-31 DIAGNOSIS — Z23 Encounter for immunization: Secondary | ICD-10-CM | POA: Diagnosis not present

## 2023-05-31 DIAGNOSIS — K219 Gastro-esophageal reflux disease without esophagitis: Secondary | ICD-10-CM

## 2023-05-31 DIAGNOSIS — Z0000 Encounter for general adult medical examination without abnormal findings: Secondary | ICD-10-CM | POA: Diagnosis not present

## 2023-05-31 DIAGNOSIS — E538 Deficiency of other specified B group vitamins: Secondary | ICD-10-CM | POA: Diagnosis not present

## 2023-05-31 DIAGNOSIS — M79603 Pain in arm, unspecified: Secondary | ICD-10-CM

## 2023-05-31 DIAGNOSIS — Z7189 Other specified counseling: Secondary | ICD-10-CM

## 2023-05-31 MED ORDER — CELECOXIB 200 MG PO CAPS: ORAL_CAPSULE | ORAL | 3 refills | Status: DC

## 2023-05-31 MED ORDER — PANTOPRAZOLE SODIUM 40 MG PO TBEC: 40.0000 mg | DELAYED_RELEASE_TABLET | Freq: Every day | ORAL | 3 refills | Status: DC

## 2023-05-31 MED ORDER — BREZTRI AEROSPHERE 160-9-4.8 MCG/ACT IN AERO: 2.0000 | INHALATION_SPRAY | Freq: Two times a day (BID) | RESPIRATORY_TRACT | Status: AC

## 2023-05-31 NOTE — Patient Instructions (Addendum)
Check with pharmacy about the RSV vaccine.  Take care.  Glad to see you. Try using the wrist brace at night and call ortho about follow up.

## 2023-05-31 NOTE — Progress Notes (Signed)
I have personally reviewed the Medicare Annual Wellness questionnaire and have noted 1. The patient's medical and social history 2. Their use of alcohol, tobacco or illicit drugs 3. Their current medications and supplements 4. The patient's functional ability including ADL's, fall risks, home safety risks and hearing or visual             impairment. 5. Diet and physical activities 6. Evidence for depression or mood disorders  The patients weight, height, BMI have been recorded in the chart and visual acuity is per eye clinic.  I have made referrals, counseling and provided education to the patient based review of the above and I have provided the pt with a written personalized care plan for preventive services.  Provider list updated- see scanned forms.  Routine anticipatory guidance given to patient.  See health maintenance. The possibility exists that previously documented standard health maintenance information may have been brought forward from a previous encounter into this note.  If needed, that same information has been updated to reflect the current situation based on today's encounter.    Flu up-to-date Shingles up-to-date PNA up-to-date Tetanus 2015 COVID-vaccine previously done RSV vaccine d/w pt.   Colonoscopy 2019 Prostate cancer screening deferred given his age. Advance directive-wife first then if needed sons equally designated if patient were incapacitated. Cognitive function addressed- see scanned forms- and if abnormal then additional documentation follows.   In addition to [facility], follow up visit for the below conditions:  Elevated Cholesterol: Using medications without problems: yes Muscle aches: likely not from statin.  Diet compliance: d/w pt.  Exercise: d/w pt.  Labs d/w pt.   GERD.  Controlled with PPI.  No ADE on med.  Compliant.  H/o CAD but not having CP.     Arthritis.  Taking celebrex prn.  He has hand stiffness at baseline.  He is  putting up with it.     COPD.  On Breztri  at baseline.  Taking SABA rarely.  Minimal cough.  Some occ SOB but w/o a clear trigger.  Some SOB with sig exertion- that is at baseline.  No wheeze.  No sputum except for some in the AM     B12 def.  Still on monthly IM injection.  Compliant.  B12 wnl.     TSH mildly elevated.  No meds.  D/w pt about observation.    R arm pain.  H/o carpal tunnel. Pain from the R hand upward on the RUE  PMH and SH reviewed  Meds, vitals, and allergies reviewed.   ROS: Per HPI.  Unless specifically indicated otherwise in HPI, the patient denies:  General: fever. Eyes: acute vision changes ENT: sore throat Cardiovascular: chest pain Respiratory: SOB GI: vomiting GU: dysuria Musculoskeletal: acute back pain Derm: acute rash Neuro: acute motor dysfunction Psych: worsening mood Endocrine: polydipsia Heme: bleeding Allergy: hayfever  GEN: nad, alert and oriented HEENT: mucous membranes moist NECK: supple w/o LA CV: rrr. PULM: ctab, no inc wob ABD: soft, +bs EXT: no edema SKIN: no acute rash Normal neck and R shoulder ROM.  R wrist tinel neg. grip intact.

## 2023-06-03 DIAGNOSIS — E785 Hyperlipidemia, unspecified: Secondary | ICD-10-CM | POA: Insufficient documentation

## 2023-06-03 DIAGNOSIS — M79603 Pain in arm, unspecified: Secondary | ICD-10-CM | POA: Insufficient documentation

## 2023-06-03 NOTE — Assessment & Plan Note (Signed)
Flu up-to-date Shingles up-to-date PNA up-to-date Tetanus 2015 COVID-vaccine previously done RSV vaccine d/w pt.   Colonoscopy 2019 Prostate cancer screening deferred given his age. Advance directive-wife first then if needed sons equally designated if patient were incapacitated. Cognitive function addressed- see scanned forms- and if abnormal then additional documentation follows.

## 2023-06-03 NOTE — Assessment & Plan Note (Signed)
Still on monthly IM injection.  Compliant.  B12 wnl.   Continue as is.

## 2023-06-03 NOTE — Assessment & Plan Note (Signed)
Continue atorvastatin

## 2023-06-03 NOTE — Assessment & Plan Note (Signed)
Advance directive-wife first then if needed sons equally designated if patient were incapacitated.

## 2023-06-03 NOTE — Assessment & Plan Note (Signed)
TSH mildly elevated.  No meds.  D/w pt about observation.

## 2023-06-03 NOTE — Assessment & Plan Note (Signed)
Discussed options.  I asked him to try using a wrist brace at night and call ortho about follow up if not better.

## 2023-06-03 NOTE — Assessment & Plan Note (Signed)
Continue Protonix °

## 2023-06-03 NOTE — Assessment & Plan Note (Signed)
Continue Breztri with as needed albuterol.  Update me as needed.  Lungs are clear.  Okay for outpatient follow-up.

## 2023-06-03 NOTE — Assessment & Plan Note (Signed)
Continue Celebrex.  Update me as needed.

## 2023-06-06 ENCOUNTER — Encounter: Payer: Self-pay | Admitting: Emergency Medicine

## 2023-06-06 ENCOUNTER — Ambulatory Visit
Admission: EM | Admit: 2023-06-06 | Discharge: 2023-06-06 | Disposition: A | Discharge status: Home / Self Care | Payer: Medicare HMO | Attending: Emergency Medicine | Admitting: Emergency Medicine

## 2023-06-06 ENCOUNTER — Telehealth: Payer: Self-pay

## 2023-06-06 DIAGNOSIS — R319 Hematuria, unspecified: Secondary | ICD-10-CM | POA: Insufficient documentation

## 2023-06-06 LAB — POCT URINALYSIS DIP (MANUAL ENTRY)
Glucose, UA: NEGATIVE mg/dL
Leukocytes, UA: NEGATIVE
Nitrite, UA: NEGATIVE
Protein Ur, POC: 100 mg/dL — AB
Spec Grav, UA: >=1.03 — AB (ref 1.010–1.025)
Urobilinogen, UA: 4 U/dL — AB
pH, UA: 5.5 (ref 5.0–8.0)

## 2023-06-06 MED ORDER — NITROFURANTOIN MONOHYD MACRO 100 MG PO CAPS: 100.0000 mg | ORAL_CAPSULE | Freq: Two times a day (BID) | ORAL | 0 refills | Status: AC

## 2023-06-06 NOTE — Discharge Instructions (Addendum)
Today you are evaluated for blood in your urine  On a urinalysis we are able to see blood but there are no signs of infectio, urine sample has been sent to the lab to determine if bacteria will grow, you will be notified if this occurs  We will prophylactically initiate antibiotic to provide coverage for bacteria, take Macrobid every morning and every evening for 7 days  It is possible that you are experiencing a kidney stone, to definitively determine that you would need higher level imaging such as a CT which we do not have available here in the urgent care  Signs of a kidney stone include that are abdominal/groin pain, nausea and vomiting, urinary frequency, urinary discomfort and blood  If there is no signs of infection on your urine sample and your symptoms continue to persist we will encourage you to reach out to your PCP for follow-up

## 2023-06-06 NOTE — ED Triage Notes (Signed)
Pt noticed blood in his urine yesterday. He denies any pain.

## 2023-06-06 NOTE — ED Provider Notes (Signed)
Renaldo Fiddler    CSN: [id number] Arrival date & time: 06/06/23  1614      History   Chief Complaint Chief Complaint  Patient presents with   Hematuria    HPI Williams Stromgren. is a 82 y.o. male.   Patient presents for evaluation of hematuria beginning 1 day ago, occurring with all episodes of urination.  Associated urinary frequency occurring every 1-2 hours.  Denies dysuria, abdominal pain, flank pain, fever, penile discharge or penile or testicle swelling.  Denies nephrotic history.  Has not attempted treatment.  Past Medical History:  Diagnosis Date   Arthritis    COPD (chronic obstructive pulmonary disease) (HCC)    GERD (gastroesophageal reflux disease)     Patient Active Problem List   Diagnosis Date Noted   HLD (hyperlipidemia) 06/03/2023   Arm pain 06/03/2023   Exertional dyspnea 09/07/2022   Healthcare maintenance 05/21/2022   Aortic atherosclerosis (HCC) 01/30/2021   Lightheadedness 12/12/2020   COPD exacerbation (HCC) 08/02/2020   Balance problem 06/06/2020   GERD (gastroesophageal reflux disease)    Arthritis    B12 deficiency 04/10/2018   Medicare annual wellness visit, subsequent 03/24/2016   Advance care planning 03/24/2016   Hand numbness 03/24/2016   Abnormal TSH 03/24/2016   COPD (chronic obstructive pulmonary disease) (HCC) 08/07/2013    Past Surgical History:  Procedure Laterality Date   APPENDECTOMY     CARPAL TUNNEL RELEASE Right    COLONOSCOPY  2013   tubular adenoma x2   INGUINAL HERNIA REPAIR Left 01/23/2016   Procedure: LEFT HERNIA REPAIR INGUINAL INCARCERATED;  Surgeon: Manus Rudd, MD;  Location: [facility] OR;  Service: General;  Laterality: Left;   lung resection     ROTATOR CUFF REPAIR Bilateral        Home Medications    Prior to Admission medications   Medication Sig Start Date End Date Taking? Authorizing Provider  amLODipine (NORVASC) 5 MG tablet Take 5 mg by mouth daily. 05/31/23  Yes [provider]   nitrofurantoin, macrocrystal-monohydrate, (MACROBID) 100 MG capsule Take 1 capsule (100 mg total) by mouth 2 (two) times daily for 7 days. 06/06/23 06/13/23 Yes Ebrima Ranta R, NP  albuterol (VENTOLIN HFA) 108 (90 Base) MCG/ACT inhaler Inhale 1-2 puffs into the lungs every 6 (six) hours as needed for wheezing or shortness of breath. Ok to fill either proair HFA or ventolin 05/19/22   Joaquim Nam, MD  aspirin EC 81 MG tablet Take 81 mg by mouth daily. 09/07/22 09/07/23  [provider]  atorvastatin (LIPITOR) 20 MG tablet Take 20 mg by mouth daily.    [provider]  Budeson-Glycopyrrol-Formoterol (BREZTRI AEROSPHERE) 160-9-4.8 MCG/ACT AERO Inhale 2 puffs into the lungs in the morning and at bedtime. 05/31/23   Joaquim Nam, MD  celecoxib (CELEBREX) 200 MG capsule TAKE 1 CAPSULE BY MOUTH DAILY AS NEEDED 05/31/23   Joaquim Nam, MD  cyanocobalamin (,VITAMIN B-12,) 1000 MCG/ML injection 1000 mcg injection IM monthly 05/30/19   Joaquim Nam, MD  pantoprazole (PROTONIX) 40 MG tablet Take 1 tablet (40 mg total) by mouth daily. 05/31/23   Joaquim Nam, MD    Family History Family History  Problem Relation Age of Onset   Uterine cancer Mother    Alcohol abuse Father    Diabetes Father    Colon cancer Neg Hx    Prostate cancer Neg Hx     Social History Social History   Tobacco Use   Smoking status: Former  Smokeless tobacco: Never   Tobacco comments:    quit 1986  Vaping Use   Vaping status: Never Used  Substance Use Topics   Alcohol use: Yes    Comment: occ   Drug use: No     Allergies   Oxycodone-acetaminophen and Aspirin   Review of Systems Review of Systems   Physical Exam Triage Vital Signs ED Triage Vitals  Encounter Vitals Group     BP 06/06/23 1636 133/84     Systolic BP Percentile --      Diastolic BP Percentile --      Pulse Rate 06/06/23 1636 88     Resp 06/06/23 1636 18     Temp 06/06/23 1636 97.8 F (36.6 C)      Temp Source 06/06/23 1636 Oral     SpO2 06/06/23 1636 93 %     Weight --      Height --      Head Circumference --      Peak Flow --      Pain Score 06/06/23 1635 0     Pain Loc --      Pain Education --      Exclude from Growth Chart --    No data found.  Updated Vital Signs BP 133/84 (BP Location: Left Arm)   Pulse 88   Temp 97.8 F (36.6 C) (Oral)   Resp 18   SpO2 93%   Visual Acuity Right Eye Distance:   Left Eye Distance:   Bilateral Distance:    Right Eye Near:   Left Eye Near:    Bilateral Near:     Physical Exam Constitutional:      Appearance: Normal appearance.  Eyes:     Extraocular Movements: Extraocular movements intact.  Abdominal:     General: Abdomen is flat. Bowel sounds are normal. There is no distension.     Palpations: Abdomen is soft.     Tenderness: There is no abdominal tenderness. There is no right CVA tenderness, left CVA tenderness or guarding.  Neurological:     Mental Status: He is alert and oriented to person, place, and time.      UC Treatments / Results  Labs (all labs ordered are listed, but only abnormal results are displayed) Labs Reviewed  POCT URINALYSIS DIP (MANUAL ENTRY) - Abnormal; Notable for the following components:      Result Value   Color, UA other (*)    Clarity, UA cloudy (*)    Bilirubin, UA moderate (*)    Ketones, POC UA trace (5) (*)    Spec Grav, UA >=1.030 (*)    Blood, UA large (*)    Protein Ur, POC =100 (*)    Urobilinogen, UA 4.0 (*)    All other components within normal limits  URINE CULTURE    EKG   Radiology No results found.  Procedures Procedures (including critical care time)  Medications Ordered in UC Medications - No data to display  Initial Impression / Assessment and Plan / UC Course  I have reviewed the triage vital signs and the nursing notes.  Pertinent labs & imaging results that were available during my care of the patient were reviewed by me and considered in my  medical decision making (see chart for details).  Hematuria  Vitals are stable, patient is in no signs of distress nontoxic-appearing with, no abdominal tenderness or CVA tenderness noted on exam, stable for outpatient management, denies back or groin pain, nausea or vomiting  and presentation is not consistent with kidney stone however advised patient to monitor, urinalysis showing red blood cells but does not show signs of infection, sent for culture, prophylactically initiating antibiotic, Macrobid sent to pharmacy discussed that the culture is negative then he was to follow-up with his PCP for any worsening symptoms he is to go to the nearest emergency department Final Clinical Impressions(s) / UC Diagnoses   Final diagnoses:  Hematuria, unspecified type     Discharge Instructions      Today you are evaluated for blood in your urine  On a urinalysis we are able to see blood but there are no signs of infectio, urine sample has been sent to the lab to determine if bacteria will grow, you will be notified if this occurs  We will prophylactically initiate antibiotic to provide coverage for bacteria, take Macrobid every morning and every evening for 7 days  It is possible that you are experiencing a kidney stone, to definitively determine that you would need higher level imaging such as a CT which we do not have available here in the urgent care  Signs of a kidney stone include that are abdominal/groin pain, nausea and vomiting, urinary frequency, urinary discomfort and blood  If there is no signs of infection on your urine sample and your symptoms continue to persist we will encourage you to reach out to your PCP for follow-up     ED Prescriptions     Medication Sig Dispense Auth. Provider   nitrofurantoin, macrocrystal-monohydrate, (MACROBID) 100 MG capsule Take 1 capsule (100 mg total) by mouth 2 (two) times daily for 7 days. 14 capsule Amara Justen, Elita Boone, NP      PDMP not  reviewed this encounter.   Valinda Hoar, NP 06/06/23 1759

## 2023-06-06 NOTE — Telephone Encounter (Signed)
Pt said starting on 06/05/23 he noticed his urine being a rusty color; on 06/05/23 pt said both sides of buttocks hurt. No pain today. Pt just urinated and there is more blood in urine now and in the last 1 - 2 hours pt has started urinating much more frequently. Pt is not having abd, or back pain. Pt said no pain upon urination. Pt does not think he is running fever. No available appts at [facility] or [facility]. UC & ED precautions given and pt voiced understanding.,.pt is going to [employer] now. Sending note to Dr Para March who is out of office and Dr Reece Agar who is in office.

## 2023-06-06 NOTE — Telephone Encounter (Signed)
Agree. Thanks

## 2023-06-06 NOTE — Telephone Encounter (Signed)
Agree with UCC to evaluate for UTI

## 2023-06-08 LAB — URINE CULTURE: Culture: <10000 — AB

## 2023-06-14 ENCOUNTER — Ambulatory Visit: Payer: Medicare HMO | Admitting: *Deleted

## 2023-06-14 DIAGNOSIS — E538 Deficiency of other specified B group vitamins: Secondary | ICD-10-CM | POA: Diagnosis not present

## 2023-06-14 MED ORDER — CYANOCOBALAMIN 1000 MCG/ML IJ SOLN
1000.0000 ug | Freq: Once | INTRAMUSCULAR | Status: AC
  Administered 2023-06-14: 1000 ug via INTRAMUSCULAR

## 2023-06-14 NOTE — Progress Notes (Signed)
Per orders of Dr. Crawford Givens, injection of B-12 given by Blenda Mounts M in left deltoid.

## 2023-06-20 DIAGNOSIS — S40211A Abrasion of right shoulder, initial encounter: Secondary | ICD-10-CM | POA: Diagnosis not present

## 2023-06-20 DIAGNOSIS — Z043 Encounter for examination and observation following other accident: Secondary | ICD-10-CM | POA: Diagnosis not present

## 2023-06-20 DIAGNOSIS — S0511XA Contusion of eyeball and orbital tissues, right eye, initial encounter: Secondary | ICD-10-CM | POA: Diagnosis not present

## 2023-06-20 DIAGNOSIS — M47812 Spondylosis without myelopathy or radiculopathy, cervical region: Secondary | ICD-10-CM | POA: Diagnosis not present

## 2023-06-20 DIAGNOSIS — S0990XA Unspecified injury of head, initial encounter: Secondary | ICD-10-CM | POA: Diagnosis not present

## 2023-06-20 DIAGNOSIS — E860 Dehydration: Secondary | ICD-10-CM | POA: Diagnosis not present

## 2023-06-20 DIAGNOSIS — W19XXXA Unspecified fall, initial encounter: Secondary | ICD-10-CM | POA: Diagnosis not present

## 2023-06-20 DIAGNOSIS — R609 Edema, unspecified: Secondary | ICD-10-CM | POA: Diagnosis not present

## 2023-06-20 DIAGNOSIS — I10 Essential (primary) hypertension: Secondary | ICD-10-CM | POA: Diagnosis not present

## 2023-06-20 DIAGNOSIS — F918 Other conduct disorders: Secondary | ICD-10-CM | POA: Diagnosis not present

## 2023-06-20 DIAGNOSIS — W1830XA Fall on same level, unspecified, initial encounter: Secondary | ICD-10-CM | POA: Diagnosis not present

## 2023-06-20 DIAGNOSIS — R220 Localized swelling, mass and lump, head: Secondary | ICD-10-CM | POA: Diagnosis not present

## 2023-07-17 ENCOUNTER — Ambulatory Visit (INDEPENDENT_AMBULATORY_CARE_PROVIDER_SITE_OTHER): Payer: Medicare Other

## 2023-07-17 DIAGNOSIS — E538 Deficiency of other specified B group vitamins: Secondary | ICD-10-CM | POA: Diagnosis not present

## 2023-07-17 MED ORDER — CYANOCOBALAMIN 1000 MCG/ML IJ SOLN
1000.0000 ug | Freq: Once | INTRAMUSCULAR | Status: AC
  Administered 2023-07-17: 1000 ug via INTRAMUSCULAR

## 2023-07-17 NOTE — Progress Notes (Signed)
 Per orders of Dr. Crawford Givens, injection of vitamin b 12 given by Lewanda Rife in right deltoid. Patient tolerated injection well. Patient will make appointment for 1 month.

## 2023-08-21 ENCOUNTER — Ambulatory Visit (INDEPENDENT_AMBULATORY_CARE_PROVIDER_SITE_OTHER): Payer: Medicare Other

## 2023-08-21 DIAGNOSIS — E538 Deficiency of other specified B group vitamins: Secondary | ICD-10-CM

## 2023-08-21 MED ORDER — CYANOCOBALAMIN 1000 MCG/ML IJ SOLN
1000.0000 ug | Freq: Once | INTRAMUSCULAR | Status: AC
  Administered 2023-08-21: 1000 ug via INTRAMUSCULAR

## 2023-08-21 NOTE — Progress Notes (Signed)
Per orders of Dr. Crawford Givens, injection of vitamin b 12 given by Lewanda Rife in left deltoid. Patient tolerated injection well. Patient will make appointment for 1 month.

## 2023-08-29 ENCOUNTER — Other Ambulatory Visit: Payer: Self-pay | Admitting: Family Medicine

## 2023-09-20 ENCOUNTER — Ambulatory Visit (INDEPENDENT_AMBULATORY_CARE_PROVIDER_SITE_OTHER): Payer: Medicare Other

## 2023-09-20 DIAGNOSIS — E538 Deficiency of other specified B group vitamins: Secondary | ICD-10-CM | POA: Diagnosis not present

## 2023-09-20 MED ORDER — CYANOCOBALAMIN 1000 MCG/ML IJ SOLN
1000.0000 ug | Freq: Once | INTRAMUSCULAR | Status: AC
  Administered 2023-09-20: 1000 ug via INTRAMUSCULAR

## 2023-09-20 NOTE — Progress Notes (Signed)
 Per orders of Dr. Para March, injection of monthly B 12 1000 mcg/ml given by Eual Fines, in Right Deltoid. Patient tolerated injection well.

## 2023-10-23 ENCOUNTER — Ambulatory Visit

## 2023-10-24 ENCOUNTER — Ambulatory Visit (INDEPENDENT_AMBULATORY_CARE_PROVIDER_SITE_OTHER)

## 2023-10-24 DIAGNOSIS — E538 Deficiency of other specified B group vitamins: Secondary | ICD-10-CM

## 2023-10-24 MED ORDER — CYANOCOBALAMIN 1000 MCG/ML IJ SOLN
1000.0000 ug | Freq: Once | INTRAMUSCULAR | Status: AC
  Administered 2023-10-24: 1000 ug via INTRAMUSCULAR

## 2023-10-24 NOTE — Progress Notes (Signed)
 Per orders of Dr. Loleta Books is out of office and Dr Eustaquio Boyden who is in office injection of vitamin b 12 given by Lewanda Rife in left deltoid.Patient tolerated injection well. Patient will make appointment for 1 month.

## 2023-11-27 ENCOUNTER — Ambulatory Visit (INDEPENDENT_AMBULATORY_CARE_PROVIDER_SITE_OTHER)

## 2023-11-27 DIAGNOSIS — E538 Deficiency of other specified B group vitamins: Secondary | ICD-10-CM | POA: Diagnosis not present

## 2023-11-27 MED ORDER — CYANOCOBALAMIN 1000 MCG/ML IJ SOLN
1000.0000 ug | Freq: Once | INTRAMUSCULAR | Status: AC
  Administered 2023-11-27: 1000 ug via INTRAMUSCULAR

## 2023-11-27 NOTE — Progress Notes (Signed)
 Per orders of Dr. Crawford Givens, injection of vitamin b 12 given by Lewanda Rife in right deltoid. Patient tolerated injection well. Patient will make appointment for 1 month.

## 2024-01-01 ENCOUNTER — Ambulatory Visit (INDEPENDENT_AMBULATORY_CARE_PROVIDER_SITE_OTHER)

## 2024-01-01 DIAGNOSIS — E538 Deficiency of other specified B group vitamins: Secondary | ICD-10-CM

## 2024-01-01 MED ORDER — CYANOCOBALAMIN 1000 MCG/ML IJ SOLN
1000.0000 ug | Freq: Once | INTRAMUSCULAR | Status: AC
  Administered 2024-01-01: 1000 ug via INTRAMUSCULAR

## 2024-01-01 NOTE — Progress Notes (Signed)
 Per orders of Dr. Crawford Givens, injection of vitamin b 12 given by Lewanda Rife in left deltoid. Patient tolerated injection well. Patient will make appointment for 1 month.

## 2024-01-21 ENCOUNTER — Other Ambulatory Visit: Payer: Self-pay | Admitting: Family Medicine

## 2024-01-27 ENCOUNTER — Other Ambulatory Visit: Payer: Self-pay | Admitting: Family Medicine

## 2024-02-05 ENCOUNTER — Ambulatory Visit (INDEPENDENT_AMBULATORY_CARE_PROVIDER_SITE_OTHER)

## 2024-02-05 DIAGNOSIS — E538 Deficiency of other specified B group vitamins: Secondary | ICD-10-CM | POA: Diagnosis not present

## 2024-02-05 MED ORDER — CYANOCOBALAMIN 1000 MCG/ML IJ SOLN
1000.0000 ug | Freq: Once | INTRAMUSCULAR | Status: AC
  Administered 2024-02-05: 1000 ug via INTRAMUSCULAR

## 2024-02-05 NOTE — Progress Notes (Cosign Needed Addendum)
After obtaining consent, and per orders of Dr. Para March, injection of B-12 given IM in right deltoid by Valentino Nose. Patient tolerated injection well.

## 2024-02-11 DIAGNOSIS — J449 Chronic obstructive pulmonary disease, unspecified: Secondary | ICD-10-CM | POA: Diagnosis not present

## 2024-02-24 ENCOUNTER — Other Ambulatory Visit: Payer: Self-pay | Admitting: Family Medicine

## 2024-02-28 DIAGNOSIS — R42 Dizziness and giddiness: Secondary | ICD-10-CM | POA: Diagnosis not present

## 2024-02-28 DIAGNOSIS — S0101XA Laceration without foreign body of scalp, initial encounter: Secondary | ICD-10-CM | POA: Diagnosis not present

## 2024-02-28 DIAGNOSIS — Z87891 Personal history of nicotine dependence: Secondary | ICD-10-CM | POA: Diagnosis not present

## 2024-02-28 DIAGNOSIS — S0990XA Unspecified injury of head, initial encounter: Secondary | ICD-10-CM | POA: Diagnosis not present

## 2024-03-11 ENCOUNTER — Ambulatory Visit (INDEPENDENT_AMBULATORY_CARE_PROVIDER_SITE_OTHER): Admitting: Family Medicine

## 2024-03-11 ENCOUNTER — Encounter: Payer: Self-pay | Admitting: Family Medicine

## 2024-03-11 ENCOUNTER — Ambulatory Visit

## 2024-03-11 VITALS — BP 132/76 | HR 80 | Temp 97.5°F | Ht 65.0 in | Wt 170.0 lb

## 2024-03-11 DIAGNOSIS — E538 Deficiency of other specified B group vitamins: Secondary | ICD-10-CM

## 2024-03-11 DIAGNOSIS — S0101XD Laceration without foreign body of scalp, subsequent encounter: Secondary | ICD-10-CM

## 2024-03-11 MED ORDER — CYANOCOBALAMIN 1000 MCG/ML IJ SOLN
1000.0000 ug | Freq: Once | INTRAMUSCULAR | Status: AC
  Administered 2024-03-11: 1000 ug via INTRAMUSCULAR

## 2024-03-11 MED ORDER — PANTOPRAZOLE SODIUM 20 MG PO TBEC: 40.0000 mg | DELAYED_RELEASE_TABLET | Freq: Two times a day (BID) | ORAL | 1 refills | Status: DC

## 2024-03-11 MED ORDER — PANTOPRAZOLE SODIUM 20 MG PO TBEC: 40.0000 mg | DELAYED_RELEASE_TABLET | Freq: Every day | ORAL | 1 refills | Status: DC

## 2024-03-11 NOTE — Patient Instructions (Signed)
 Schedule a yearly visit in November.  Labs ahead of time is possible.  Take care.  Glad to see you.
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
## 2024-03-12 DIAGNOSIS — S0101XA Laceration without foreign body of scalp, initial encounter: Secondary | ICD-10-CM | POA: Insufficient documentation

## 2024-03-12 NOTE — Assessment & Plan Note (Signed)
 Healed, staples removed, routine cautions given to patient.  Tolerated well.  Update me as needed.

## 2024-04-10 ENCOUNTER — Ambulatory Visit (INDEPENDENT_AMBULATORY_CARE_PROVIDER_SITE_OTHER)

## 2024-04-10 DIAGNOSIS — E538 Deficiency of other specified B group vitamins: Secondary | ICD-10-CM

## 2024-04-10 MED ORDER — CYANOCOBALAMIN 1000 MCG/ML IJ SOLN
1000.0000 ug | Freq: Once | INTRAMUSCULAR | Status: AC
  Administered 2024-04-10: 1000 ug via INTRAMUSCULAR

## 2024-04-10 NOTE — Progress Notes (Signed)
 Per orders of Dr. Arlyss Cleatus lovely is out of office and T Dugal FNP who is in office injection of vitamin b 12 given by Laray Arenas in right deltoid. Patient tolerated injection well. Patient will make appointment for 1 month.

## 2024-05-13 ENCOUNTER — Ambulatory Visit

## 2024-05-21 ENCOUNTER — Other Ambulatory Visit: Payer: Self-pay | Admitting: Family Medicine

## 2024-05-21 DIAGNOSIS — E538 Deficiency of other specified B group vitamins: Secondary | ICD-10-CM

## 2024-05-21 DIAGNOSIS — R739 Hyperglycemia, unspecified: Secondary | ICD-10-CM

## 2024-05-21 DIAGNOSIS — E785 Hyperlipidemia, unspecified: Secondary | ICD-10-CM

## 2024-05-27 ENCOUNTER — Other Ambulatory Visit: Payer: Self-pay | Admitting: Family Medicine

## 2024-05-27 DIAGNOSIS — M1990 Unspecified osteoarthritis, unspecified site: Secondary | ICD-10-CM

## 2024-05-28 NOTE — Telephone Encounter (Signed)
 Celebrex  Last filled:  02/23/24, #920 Last OV:  03/11/24, HFU Next OV:  06/10/24, annual exam

## 2024-06-03 ENCOUNTER — Other Ambulatory Visit (INDEPENDENT_AMBULATORY_CARE_PROVIDER_SITE_OTHER)

## 2024-06-03 DIAGNOSIS — E538 Deficiency of other specified B group vitamins: Secondary | ICD-10-CM | POA: Diagnosis not present

## 2024-06-03 DIAGNOSIS — R739 Hyperglycemia, unspecified: Secondary | ICD-10-CM

## 2024-06-03 DIAGNOSIS — E785 Hyperlipidemia, unspecified: Secondary | ICD-10-CM

## 2024-06-03 LAB — COMPREHENSIVE METABOLIC PANEL WITH GFR
ALT: 14 U/L (ref 0–53)
AST: 15 U/L (ref 0–37)
Albumin: 4.2 g/dL (ref 3.5–5.2)
Alkaline Phosphatase: 59 U/L (ref 39–117)
BUN: 17 mg/dL (ref 6–23)
CO2: 30 meq/L (ref 19–32)
Calcium: 9.6 mg/dL (ref 8.4–10.5)
Chloride: 104 meq/L (ref 96–112)
Creatinine, Ser: 1.08 mg/dL (ref 0.40–1.50)
GFR: 63.93 mL/min (ref >60.00)
Glucose, Bld: 103 mg/dL — ABNORMAL HIGH (ref 70–99)
Potassium: 4.4 meq/L (ref 3.5–5.1)
Sodium: 139 meq/L (ref 135–145)
Total Bilirubin: 0.9 mg/dL (ref 0.2–1.2)
Total Protein: 6.6 g/dL (ref 6.0–8.3)

## 2024-06-03 LAB — LIPID PANEL
Cholesterol: 95 mg/dL (ref 0–200)
HDL: 44.1 mg/dL (ref >39.00)
LDL Cholesterol: 40 mg/dL (ref 0–99)
NonHDL: 50.71
Total CHOL/HDL Ratio: 2
Triglycerides: 56 mg/dL (ref 0.0–149.0)
VLDL: 11.2 mg/dL (ref 0.0–40.0)

## 2024-06-03 LAB — HEMOGLOBIN A1C: Hgb A1c MFr Bld: 5.3 % (ref 4.6–6.5)

## 2024-06-03 LAB — VITAMIN B12: Vitamin B-12: 300 pg/mL (ref 211–911)

## 2024-06-03 LAB — TSH: TSH: 4.28 u[IU]/mL (ref 0.35–5.50)

## 2024-06-08 ENCOUNTER — Ambulatory Visit: Payer: Self-pay | Admitting: Family Medicine

## 2024-06-10 ENCOUNTER — Encounter: Payer: Self-pay | Admitting: Family Medicine

## 2024-06-10 ENCOUNTER — Ambulatory Visit: Admitting: Family Medicine

## 2024-06-10 VITALS — BP 120/62 | HR 81 | Temp 97.8°F | Ht 66.0 in | Wt 172.4 lb

## 2024-06-10 DIAGNOSIS — Z7189 Other specified counseling: Secondary | ICD-10-CM

## 2024-06-10 DIAGNOSIS — R319 Hematuria, unspecified: Secondary | ICD-10-CM

## 2024-06-10 DIAGNOSIS — M1990 Unspecified osteoarthritis, unspecified site: Secondary | ICD-10-CM

## 2024-06-10 DIAGNOSIS — Z0000 Encounter for general adult medical examination without abnormal findings: Secondary | ICD-10-CM

## 2024-06-10 DIAGNOSIS — E785 Hyperlipidemia, unspecified: Secondary | ICD-10-CM

## 2024-06-10 DIAGNOSIS — J449 Chronic obstructive pulmonary disease, unspecified: Secondary | ICD-10-CM

## 2024-06-10 DIAGNOSIS — Z23 Encounter for immunization: Secondary | ICD-10-CM

## 2024-06-10 DIAGNOSIS — R7989 Other specified abnormal findings of blood chemistry: Secondary | ICD-10-CM

## 2024-06-10 DIAGNOSIS — E538 Deficiency of other specified B group vitamins: Secondary | ICD-10-CM

## 2024-06-10 DIAGNOSIS — K219 Gastro-esophageal reflux disease without esophagitis: Secondary | ICD-10-CM

## 2024-06-10 DIAGNOSIS — I10 Essential (primary) hypertension: Secondary | ICD-10-CM

## 2024-06-10 MED ORDER — AMLODIPINE BESYLATE 2.5 MG PO TABS: 2.5000 mg | ORAL_TABLET | Freq: Every day | ORAL | 3 refills | Status: AC

## 2024-06-10 MED ORDER — CYANOCOBALAMIN 1000 MCG/ML IJ SOLN
1000.0000 ug | Freq: Once | INTRAMUSCULAR | Status: AC
  Administered 2024-06-10: 1000 ug via INTRAMUSCULAR

## 2024-06-10 NOTE — Progress Notes (Unsigned)
 I have personally reviewed the Medicare Annual Wellness questionnaire and have noted 1. The patient's medical and social history 2. Their use of alcohol, tobacco or illicit drugs 3. Their current medications and supplements 4. The patient's functional ability including ADL's, fall risks, home safety risks and hearing or visual             impairment. 5. Diet and physical activities 6. Evidence for depression or mood disorders  The patients weight, height, BMI have been recorded in the chart and visual acuity is per eye clinic.  I have made referrals, counseling and provided education to the patient based review of the above and I have provided the pt with a written personalized care plan for preventive services.  Provider list updated- see scanned forms.  Routine anticipatory guidance given to patient.  See health maintenance. The possibility exists that previously documented standard health maintenance information may have been brought forward from a previous encounter into this note.  If needed, that same information has been updated to reflect the current situation based on today's encounter.    Flu Shingles PNA Tetanus Colon  Breast cancer screening Prostate cancer screening Advance directive Cognitive function addressed- see scanned forms- and if abnormal then additional documentation follows.   In addition to [facility], follow up visit for the below conditions:  His son has been profoundly sick, d/w pt.  I thanked patient for his effort.  His son is back home now.   LUTS. Variable.  Cr wnl.  H/o abd u/a with occ hematuria.    Elevated Cholesterol: Using medications without problems: yes Muscle aches: likely not from statin.  Diet compliance: d/w pt.  Exercise: d/w pt.  Labs d/w pt.   GERD.  Controlled with PPI.  No ADE on med.  Compliant.   H/o CAD but not having CP.   Hypertension:    Using medication without problems or lightheadedness:  yes Chest pain with  exertion:no Edema:mild BLE edema.  Short of breath: some, due to COPD.    Arthritis.  Taking celebrex  prn.  He has hand stiffness at baseline.  He is putting up with it.     COPD.  On Breztri  at baseline.  Taking SABA rarely.  Minimal cough.  Some occ SOB but w/o a clear trigger.  Some SOB with sig exertion- that is at baseline.  No wheeze.  Some occ sputum.     B12 def.  Still on monthly IM injection.  Compliant.  B12 wnl.     TSH prev mildly elevated.  Normal 2025.  No meds.  D/w pt about observation.    PMH and SH reviewed  Meds, vitals, and allergies reviewed.   ROS: Per HPI.  Unless specifically indicated otherwise in HPI, the patient denies:  General: fever. Eyes: acute vision changes ENT: sore throat Cardiovascular: chest pain Respiratory: SOB GI: vomiting GU: dysuria Musculoskeletal: acute back pain Derm: acute rash Neuro: acute motor dysfunction Psych: worsening mood Endocrine: polydipsia Heme: bleeding Allergy: hayfever  GEN: nad, alert and oriented HEENT: mucous membranes moist NECK: supple w/o LA CV: rrr. PULM: ctab, no inc wob ABD: soft, +bs EXT: trace BLE edema SKIN: no acute rash

## 2024-06-10 NOTE — Patient Instructions (Signed)
 Go to the lab on the way out.   If you have mychart we'll likely use that to update you.    Take care.  Glad to see you. B12 and flu shot today.  Try the lower dose of amlodipine, 2.5mg  per day. Let me know if you still having trouble getting lightheaded.

## 2024-06-11 ENCOUNTER — Ambulatory Visit: Payer: Self-pay | Admitting: Family Medicine

## 2024-06-11 ENCOUNTER — Encounter: Payer: Self-pay | Admitting: Family Medicine

## 2024-06-11 DIAGNOSIS — R319 Hematuria, unspecified: Secondary | ICD-10-CM

## 2024-06-11 DIAGNOSIS — I10 Essential (primary) hypertension: Secondary | ICD-10-CM | POA: Insufficient documentation

## 2024-06-11 LAB — URINALYSIS, ROUTINE W REFLEX MICROSCOPIC
Bilirubin Urine: NEGATIVE
Ketones, ur: NEGATIVE
Leukocytes,Ua: NEGATIVE
Nitrite: NEGATIVE
RBC / HPF: NONE SEEN (ref 0–?)
Specific Gravity, Urine: 1.02 (ref 1.000–1.030)
Total Protein, Urine: NEGATIVE
Urine Glucose: NEGATIVE
Urobilinogen, UA: 1 (ref 0.0–1.0)
pH: 6 (ref 5.0–8.0)

## 2024-06-11 NOTE — Assessment & Plan Note (Signed)
Continue amlodipine.  Labs discussed with patient.

## 2024-06-11 NOTE — Assessment & Plan Note (Signed)
 Continue Celebrex

## 2024-06-11 NOTE — Assessment & Plan Note (Signed)
 Flu 2025 Shingles previously done PNA previously done Tetanus previously done. RSV previously done. COVID previously done. Colon cancer screening deferred given his age. Prostate cancer screening deferred 2025 given his age. Advance directive discussed with patient.  Wife designated if patient were incapacitated, then his sons if needed. Cognitive function addressed- see scanned forms- and if abnormal then additional documentation follows.

## 2024-06-11 NOTE — Assessment & Plan Note (Signed)
 Labs d/w pt. atorvastatin.

## 2024-06-11 NOTE — Assessment & Plan Note (Signed)
 Controlled with PPI.  No ADE on med.  Compliant.

## 2024-06-11 NOTE — Assessment & Plan Note (Signed)
 Lungs clear.  Okay for outpatient follow-up. On Breztri  at baseline.  Taking SABA rarely.  Minimal cough.  Continue as is.

## 2024-06-11 NOTE — Assessment & Plan Note (Signed)
 History of. TSH prev mildly elevated.  Normal 2025.  No meds.  D/w pt about observation.

## 2024-06-11 NOTE — Assessment & Plan Note (Signed)
 Still on monthly IM injection.  Compliant.  B12 wnl.   Continue as is.

## 2024-06-11 NOTE — Assessment & Plan Note (Signed)
History of.  See notes on labs. 

## 2024-06-11 NOTE — Assessment & Plan Note (Signed)
 Advance directive discussed with patient.  Wife designated if patient were incapacitated, then his sons if needed.

## 2024-07-13 NOTE — Progress Notes (Unsigned)
 "  07/14/2024 9:13 PM   Alm Lang Raddle. 05/04/1941 [id number]  Referring provider: Cleatus Arlyss RAMAN, MD [address] [place],  [place] [address]  No chief complaint on file.   HPI: Ricardo Wilson. is a 84 y.o. fe male referred for evaluation and management of hematuria  Urinalysis: Dipstick 06/06/2023 large blood; negative RBC microscopy 06/11/2024 Gross hematuria: Associated lower urinary tract symptoms: None Baseline lower urinary tract symptoms: None Pain: Denied flank, abdominal or pelvic pain Prior urologic history: Blood thinners/antiplatelet meds: Tobacco history:   PMH: Past Medical History:  Diagnosis Date   Arthritis    COPD (chronic obstructive pulmonary disease) (HCC)    GERD (gastroesophageal reflux disease)    HTN (hypertension) 06/11/2024    Surgical History: Past Surgical History:  Procedure Laterality Date   APPENDECTOMY     CARPAL TUNNEL RELEASE Right    COLONOSCOPY  2013   tubular adenoma x2   INGUINAL HERNIA REPAIR Left 01/23/2016   Procedure: LEFT HERNIA REPAIR INGUINAL INCARCERATED;  Surgeon: Donnice Lima, MD;  Location: [facility] OR;  Service: General;  Laterality: Left;   lung resection     ROTATOR CUFF REPAIR Bilateral     Home Medications:  Allergies as of 07/14/2024       Reactions   Oxycodone -acetaminophen  Other (See Comments)   Aspirin Nausea And Vomiting   As a child        Medication List        Accurate as of July 13, 2024  9:13 PM. If you have any questions, ask your nurse or doctor.          albuterol  108 (90 Base) MCG/ACT inhaler Commonly known as: VENTOLIN  HFA Inhale 1-2 puffs into the lungs every 6 (six) hours as needed for wheezing or shortness of breath. Ok to fill either proair  HFA or ventolin    amLODipine  2.5 MG tablet Commonly known as: NORVASC  Take 1 tablet (2.5 mg total) by mouth daily.   atorvastatin 20 MG tablet Commonly known as: LIPITOR Take 20 mg by mouth daily.   Breztri  Aerosphere 160-9-4.8  MCG/ACT Aero inhaler Generic drug: budesonide-glycopyrrolate-formoterol Inhale 2 puffs into the lungs in the morning and at bedtime.   celecoxib  200 MG capsule Commonly known as: CELEBREX  TAKE 1 CAPSULE BY MOUTH DAILY AS NEEDED   cyanocobalamin  1000 MCG/ML injection Commonly known as: VITAMIN B12 1000 mcg injection IM monthly   pantoprazole  20 MG tablet Commonly known as: PROTONIX  Take 2 tablets (40 mg total) by mouth daily.        Allergies: Allergies[1]  Family History: Family History  Problem Relation Age of Onset   Uterine cancer Mother    Alcohol abuse Father    Diabetes Father    Colon cancer Neg Hx    Prostate cancer Neg Hx     Social History:  reports that he has quit smoking. He has never used smokeless tobacco. He reports current alcohol use. He reports that he does not use drugs.   Physical Exam: There were no vitals taken for this visit.  Constitutional:  Alert and oriented, No acute distress. HEENT:  AT. Respiratory: Normal respiratory effort, no increased work of breathing. GI: Abdomen is soft, nontender, nondistended, no abdominal masses GU: No CVA tenderness Psychiatric: Normal mood and affect.  Laboratory Data: Lab Results  Component Value Date   WBC 9.1 01/22/2016   HGB 13.9 01/22/2016   HCT 41.0 01/22/2016   MCV 88.2 01/22/2016   PLT 201 01/22/2016    Lab  Results  Component Value Date   CREATININE 1.08 06/03/2024    No results found for: PSA  No results found for: TESTOSTERONE  Lab Results  Component Value Date   HGBA1C 5.3 06/03/2024    Urinalysis    Component Value Date/Time   COLORURINE YELLOW 06/11/2024 0732   APPEARANCEUR CLEAR 06/11/2024 0732   LABSPEC 1.020 06/11/2024 0732   PHURINE 6.0 06/11/2024 0732   GLUCOSEU NEGATIVE 06/11/2024 0732   HGBUR TRACE-LYSED (A) 06/11/2024 0732   BILIRUBINUR NEGATIVE 06/11/2024 0732   BILIRUBINUR moderate (A) 06/06/2023 1631   KETONESUR NEGATIVE 06/11/2024 0732   PROTEINUR  =100 (A) 06/06/2023 1631   UROBILINOGEN 1.0 06/11/2024 0732   NITRITE NEGATIVE 06/11/2024 0732   LEUKOCYTESUR NEGATIVE 06/11/2024 0732    Lab Results  Component Value Date   MUCUS Presence of (A) 06/11/2024    Pertinent Imaging: *** No results found for this or any previous visit.  No results found for this or any previous visit.  No results found for this or any previous visit.  No results found for this or any previous visit.  No results found for this or any previous visit.  No results found for this or any previous visit.  No results found for this or any previous visit.  No results found for this or any previous visit.   Assessment & Plan:    1.  Gross hematuria AUA risk stratification: High We discussed the recommended evaluation of high risk hematuria which consist of CT urogram and cystoscopy.  The procedures were discussed in detail and he/she has elected to proceed with further evaluation All questions were answered CTU order placed and cystoscopy was scheduled   No follow-ups on file.  Glendia JAYSON Barba, MD  [facility] [address] [place], [place] [address] [phone]     [1]  Allergies Allergen Reactions   Oxycodone -Acetaminophen  Other (See Comments)   Aspirin Nausea And Vomiting    As a child   "

## 2024-07-14 ENCOUNTER — Encounter: Payer: Self-pay | Admitting: Urology

## 2024-07-14 ENCOUNTER — Ambulatory Visit: Admitting: Urology

## 2024-07-14 VITALS — BP 134/86 | HR 90 | Ht 67.0 in | Wt 172.0 lb

## 2024-07-14 DIAGNOSIS — R310 Gross hematuria: Secondary | ICD-10-CM

## 2024-07-14 NOTE — Patient Instructions (Signed)
 Scheduling number: [id number]

## 2024-07-15 LAB — URINALYSIS, COMPLETE
Bilirubin, UA: NEGATIVE
Glucose, UA: NEGATIVE
Ketones, UA: NEGATIVE
Leukocytes,UA: NEGATIVE
Nitrite, UA: NEGATIVE
Protein,UA: NEGATIVE
RBC, UA: NEGATIVE
Specific Gravity, UA: 1.015 (ref 1.005–1.030)
Urobilinogen, Ur: 0.2 mg/dL (ref 0.2–1.0)
pH, UA: 5.5 (ref 5.0–7.5)

## 2024-07-15 LAB — MICROSCOPIC EXAMINATION: Bacteria, UA: NONE SEEN

## 2024-07-16 ENCOUNTER — Ambulatory Visit

## 2024-07-16 DIAGNOSIS — E538 Deficiency of other specified B group vitamins: Secondary | ICD-10-CM

## 2024-07-16 MED ORDER — CYANOCOBALAMIN 1000 MCG/ML IJ SOLN
1000.0000 ug | Freq: Once | INTRAMUSCULAR | Status: AC
  Administered 2024-07-16: 1000 ug via INTRAMUSCULAR

## 2024-07-16 NOTE — Progress Notes (Signed)
 Per orders of Dr. Emmette Harms is not in office and Dr Claire Crick who is in office injection of vitamin b 12 given by Claretha Crocker in right deltoid. Patient tolerated injection well. Patient will make appointment for 1 month.

## 2024-08-06 ENCOUNTER — Telehealth: Payer: Self-pay | Admitting: Family Medicine

## 2024-08-06 MED ORDER — PANTOPRAZOLE SODIUM 20 MG PO TBEC: 40.0000 mg | DELAYED_RELEASE_TABLET | Freq: Every day | ORAL | 1 refills | Status: AC

## 2024-08-06 NOTE — Telephone Encounter (Signed)
 Copied from CRM [id number]. Topic: Clinical - Medication Refill >> Aug 06, 2024 10:40 AM Delon T wrote: Medication: pantoprazole  (PROTONIX ) 20 MG tablet   Has the patient contacted their pharmacy? Yes (Agent: If no, request that the patient contact the pharmacy for the refill. If patient does not wish to contact the pharmacy document the reason why and proceed with request.) (Agent: If yes, when and what did the pharmacy advise?)  This is the patient's preferred pharmacy:  [id number] - [place], [place] - [address] AT [facility] OF [address] [facility] [address] [place] [address] Phone: [phone] Fax: [phone]  Is this the correct pharmacy for this prescription? Yes If no, delete pharmacy and type the correct one.   Has the prescription been filled recently? Yes  Is the patient out of the medication? Yes  Has the patient been seen for an appointment in the last year OR does the patient have an upcoming appointment? Yes  Can we respond through MyChart? No  Agent: Please be advised that Rx refills may take up to 3 business days. We ask that you follow-up with your pharmacy.

## 2024-08-11 ENCOUNTER — Ambulatory Visit
Admission: RE | Admit: 2024-08-11 | Discharge: 2024-08-11 | Disposition: A | Source: Ambulatory Visit | Attending: Urology | Admitting: Urology

## 2024-08-11 ENCOUNTER — Ambulatory Visit: Payer: Self-pay | Admitting: Urology

## 2024-08-11 DIAGNOSIS — R310 Gross hematuria: Secondary | ICD-10-CM

## 2024-08-11 MED ORDER — IOHEXOL 300 MG/ML  SOLN
100.0000 mL | Freq: Once | INTRAMUSCULAR | Status: AC | PRN
  Administered 2024-08-11: 100 mL via INTRAVENOUS

## 2024-08-19 ENCOUNTER — Ambulatory Visit

## 2024-09-10 ENCOUNTER — Other Ambulatory Visit: Admitting: Urology
